# Patient Record
Sex: Female | Born: 1975 | Race: White | Hispanic: No | Marital: Married | Smoking: Former smoker
Health system: Southern US, Community
[De-identification: ages and names within clinical notes are randomized; demographics above are authoritative.]

## PROBLEM LIST (undated history)

## (undated) DIAGNOSIS — T7840XA Allergy, unspecified, initial encounter: Secondary | ICD-10-CM

## (undated) HISTORY — DX: Allergy, unspecified, initial encounter: T78.40XA

---

## 2013-05-26 ENCOUNTER — Ambulatory Visit: Payer: Self-pay | Admitting: Emergency Medicine

## 2015-04-20 ENCOUNTER — Other Ambulatory Visit: Payer: Self-pay | Admitting: Family Medicine

## 2015-04-20 DIAGNOSIS — Z1231 Encounter for screening mammogram for malignant neoplasm of breast: Secondary | ICD-10-CM

## 2015-04-23 ENCOUNTER — Ambulatory Visit: Payer: Self-pay

## 2015-05-15 ENCOUNTER — Ambulatory Visit
Admission: RE | Admit: 2015-05-15 | Discharge: 2015-05-15 | Disposition: A | Discharge status: Home / Self Care | Payer: PRIVATE HEALTH INSURANCE | Source: Ambulatory Visit | Attending: Family Medicine | Admitting: Family Medicine

## 2015-05-15 DIAGNOSIS — Z1231 Encounter for screening mammogram for malignant neoplasm of breast: Secondary | ICD-10-CM | POA: Diagnosis not present

## 2016-08-18 ENCOUNTER — Other Ambulatory Visit: Payer: Self-pay | Admitting: Family Medicine

## 2016-08-18 DIAGNOSIS — Z1231 Encounter for screening mammogram for malignant neoplasm of breast: Secondary | ICD-10-CM

## 2016-08-25 ENCOUNTER — Encounter (INDEPENDENT_AMBULATORY_CARE_PROVIDER_SITE_OTHER): Payer: Self-pay

## 2016-08-25 ENCOUNTER — Ambulatory Visit
Admission: RE | Admit: 2016-08-25 | Discharge: 2016-08-25 | Disposition: A | Discharge status: Home / Self Care | Payer: PRIVATE HEALTH INSURANCE | Source: Ambulatory Visit | Attending: Family Medicine | Admitting: Family Medicine

## 2016-08-25 DIAGNOSIS — Z1231 Encounter for screening mammogram for malignant neoplasm of breast: Secondary | ICD-10-CM | POA: Diagnosis present

## 2018-04-08 ENCOUNTER — Other Ambulatory Visit: Payer: Self-pay | Admitting: Family Medicine

## 2018-04-08 DIAGNOSIS — Z1231 Encounter for screening mammogram for malignant neoplasm of breast: Secondary | ICD-10-CM

## 2018-04-27 ENCOUNTER — Ambulatory Visit
Admission: RE | Admit: 2018-04-27 | Discharge: 2018-04-27 | Disposition: A | Discharge status: Home / Self Care | Payer: PRIVATE HEALTH INSURANCE | Source: Ambulatory Visit | Attending: Family Medicine | Admitting: Family Medicine

## 2018-04-27 DIAGNOSIS — Z1231 Encounter for screening mammogram for malignant neoplasm of breast: Secondary | ICD-10-CM | POA: Insufficient documentation

## 2019-05-24 ENCOUNTER — Other Ambulatory Visit: Payer: Self-pay

## 2019-05-24 ENCOUNTER — Ambulatory Visit (INDEPENDENT_AMBULATORY_CARE_PROVIDER_SITE_OTHER): Payer: PRIVATE HEALTH INSURANCE | Admitting: Podiatry

## 2019-05-24 ENCOUNTER — Ambulatory Visit (INDEPENDENT_AMBULATORY_CARE_PROVIDER_SITE_OTHER): Payer: PRIVATE HEALTH INSURANCE

## 2019-05-24 DIAGNOSIS — T7840XA Allergy, unspecified, initial encounter: Secondary | ICD-10-CM | POA: Insufficient documentation

## 2019-05-24 DIAGNOSIS — F329 Major depressive disorder, single episode, unspecified: Secondary | ICD-10-CM | POA: Insufficient documentation

## 2019-05-24 DIAGNOSIS — M722 Plantar fascial fibromatosis: Secondary | ICD-10-CM

## 2019-05-24 DIAGNOSIS — F418 Other specified anxiety disorders: Secondary | ICD-10-CM | POA: Insufficient documentation

## 2019-05-24 DIAGNOSIS — F32A Depression, unspecified: Secondary | ICD-10-CM | POA: Insufficient documentation

## 2019-05-24 DIAGNOSIS — J309 Allergic rhinitis, unspecified: Secondary | ICD-10-CM | POA: Insufficient documentation

## 2019-05-24 DIAGNOSIS — Z3042 Encounter for surveillance of injectable contraceptive: Secondary | ICD-10-CM | POA: Insufficient documentation

## 2019-05-24 MED ORDER — METHYLPREDNISOLONE 4 MG PO TBPK: ORAL_TABLET | ORAL | 0 refills | Status: DC

## 2019-05-24 MED ORDER — DICLOFENAC SODIUM 75 MG PO TBEC: 75.0000 mg | DELAYED_RELEASE_TABLET | Freq: Two times a day (BID) | ORAL | 1 refills | Status: DC

## 2019-05-26 NOTE — Progress Notes (Signed)
   Subjective: 43 y.o. female presenting today as a new patient with a chief complaint of stabbing, tearing pain to the bilateral heels that began about 2-3 months ago. She states the pain in the left heel radiates to the arch but is not as bad as the right heel. She states the pain is worse in the morning or when she stands after sitting for a long period of time. She has been using Voltaren Gel for treatment. Patient is here for further evaluation and treatment.   No past medical history on file.   Objective: Physical Exam General: The patient is alert and oriented x3 in no acute distress.  Dermatology: Skin is warm, dry and supple bilateral lower extremities. Negative for open lesions or macerations bilateral.   Vascular: Dorsalis Pedis and Posterior Tibial pulses palpable bilateral.  Capillary fill time is immediate to all digits.  Neurological: Epicritic and protective threshold intact bilateral.   Musculoskeletal: Tenderness to palpation to the plantar aspect of the bilateral heels along the plantar fascia. All other joints range of motion within normal limits bilateral. Strength 5/5 in all groups bilateral.   Radiographic exam: Normal osseous mineralization. Joint spaces preserved. No fracture/dislocation/boney destruction. No other soft tissue abnormalities or radiopaque foreign bodies.   Assessment: 1. plantar fasciitis bilateral feet  Plan of Care:  1. Patient evaluated. Xrays reviewed.   2. Injection of 0.5cc Celestone soluspan injected into the bilateral heels.  3. Rx for Medrol Dose Pak placed 4. Rx for Diclofenac ordered for patient. 5. Continue using OTC Voltaren gel 1%.  6. Continue using carbon fiber orthotics.  7. Return to clinic in 4 weeks.     Edrick Kins, DPM [facility]  Dr. Edrick Kins, DPM    [address], [place] [address]                Office [phone]  Fax [phone]

## 2019-05-30 ENCOUNTER — Other Ambulatory Visit: Payer: Self-pay | Admitting: Specialist

## 2019-05-30 DIAGNOSIS — Z1231 Encounter for screening mammogram for malignant neoplasm of breast: Secondary | ICD-10-CM

## 2019-06-21 ENCOUNTER — Ambulatory Visit: Payer: PRIVATE HEALTH INSURANCE | Admitting: Podiatry

## 2019-08-30 ENCOUNTER — Ambulatory Visit
Admission: RE | Admit: 2019-08-30 | Discharge: 2019-08-30 | Disposition: A | Discharge status: Home / Self Care | Payer: PRIVATE HEALTH INSURANCE | Source: Ambulatory Visit | Attending: Specialist | Admitting: Specialist

## 2019-08-30 ENCOUNTER — Other Ambulatory Visit: Payer: Self-pay

## 2019-08-30 DIAGNOSIS — Z1231 Encounter for screening mammogram for malignant neoplasm of breast: Secondary | ICD-10-CM | POA: Diagnosis present

## 2019-08-31 ENCOUNTER — Telehealth: Payer: Self-pay | Admitting: Podiatry

## 2019-08-31 NOTE — Telephone Encounter (Signed)
Returned call to patient to schedule appointment for her complaint of plantar fascitis. Left v/m mess.

## 2019-09-09 ENCOUNTER — Ambulatory Visit: Payer: PRIVATE HEALTH INSURANCE | Admitting: Podiatry

## 2019-10-28 ENCOUNTER — Other Ambulatory Visit: Payer: Self-pay

## 2019-10-28 ENCOUNTER — Ambulatory Visit: Payer: PRIVATE HEALTH INSURANCE | Admitting: Podiatry

## 2019-10-28 VITALS — Temp 97.6°F

## 2019-10-28 DIAGNOSIS — M722 Plantar fascial fibromatosis: Secondary | ICD-10-CM | POA: Diagnosis not present

## 2019-10-28 MED ORDER — DICLOFENAC SODIUM 75 MG PO TBEC: 75.0000 mg | DELAYED_RELEASE_TABLET | Freq: Two times a day (BID) | ORAL | 1 refills | Status: DC

## 2019-10-28 MED ORDER — TERBINAFINE HCL 250 MG PO TABS: 250.0000 mg | ORAL_TABLET | Freq: Every day | ORAL | 0 refills | Status: DC

## 2019-11-01 NOTE — Progress Notes (Signed)
   Subjective: 44 y.o. female presenting today for follow up evaluation of plantar fasciitis of the bilateral feet. She denies any current pain but is concerned that her 45 year old orthotics are not giving her enough support. She also reports some thickening and discoloration of the right great toenail that began a few months ago. She denies pain or modifying factors. She has not had any treatment for the symptoms. Patient is here for further evaluation and treatment.   No past medical history on file.   Objective: Physical Exam General: The patient is alert and oriented x3 in no acute distress.  Dermatology: Hyperkeratotic, discolored, thickened, onychodystrophy noted to the right great toenail. Skin is warm, dry and supple bilateral lower extremities. Negative for open lesions or macerations bilateral.   Vascular: Dorsalis Pedis and Posterior Tibial pulses palpable bilateral.  Capillary fill time is immediate to all digits.  Neurological: Epicritic and protective threshold intact bilateral.   Musculoskeletal: Tenderness to palpation to the plantar aspect of the bilateral heels along the plantar fascia. All other joints range of motion within normal limits bilateral. Strength 5/5 in all groups bilateral.    Assessment: 1. plantar fasciitis bilateral feet 2. Onychomycosis right great toenail   Plan of Care:  1. Patient evaluated.  2. Injection of 0.5cc Celestone soluspan injected into the right heel.  3. Refill prescription for Diclofenac provided to patient. 4. Appointment with Pedorthist for custom molded orthotics.  5. Prescription for Lamisil 250 mg #90 provided to patient.  6. Inquired about liver function and patient declines any known history of issue or problems.  7. Return to clinic as needed.   Felecia Shelling, DPM [facility]  Dr. Felecia Shelling, DPM    [address] [place], [place] [address]                Office  [phone]  Fax [phone]

## 2019-11-23 ENCOUNTER — Other Ambulatory Visit: Payer: PRIVATE HEALTH INSURANCE | Admitting: Orthotics

## 2019-12-07 ENCOUNTER — Ambulatory Visit: Payer: PRIVATE HEALTH INSURANCE | Admitting: Orthotics

## 2019-12-07 ENCOUNTER — Encounter: Payer: Self-pay | Admitting: *Deleted

## 2019-12-07 ENCOUNTER — Other Ambulatory Visit: Payer: Self-pay

## 2019-12-07 DIAGNOSIS — M722 Plantar fascial fibromatosis: Secondary | ICD-10-CM

## 2019-12-07 NOTE — Progress Notes (Signed)

## 2020-01-11 ENCOUNTER — Other Ambulatory Visit: Payer: Self-pay

## 2020-01-11 ENCOUNTER — Ambulatory Visit (INDEPENDENT_AMBULATORY_CARE_PROVIDER_SITE_OTHER): Payer: PRIVATE HEALTH INSURANCE | Admitting: Orthotics

## 2020-01-11 DIAGNOSIS — M722 Plantar fascial fibromatosis: Secondary | ICD-10-CM | POA: Diagnosis not present

## 2020-01-11 NOTE — Progress Notes (Signed)
Patient came in today to pick up custom made foot orthotics.  The goals were accomplished and the patient reported no dissatisfaction with said orthotics.  Patient was advised of breakin period and how to report any issues. 

## 2020-10-23 DIAGNOSIS — M722 Plantar fascial fibromatosis: Secondary | ICD-10-CM | POA: Insufficient documentation

## 2020-10-23 LAB — HM PAP SMEAR: HM Pap smear: NORMAL

## 2020-10-23 LAB — RESULTS CONSOLE HPV: CHL HPV: NEGATIVE

## 2020-10-24 ENCOUNTER — Other Ambulatory Visit: Payer: Self-pay | Admitting: Specialist

## 2020-10-24 DIAGNOSIS — Z1231 Encounter for screening mammogram for malignant neoplasm of breast: Secondary | ICD-10-CM

## 2020-12-11 ENCOUNTER — Other Ambulatory Visit: Payer: Self-pay

## 2020-12-11 ENCOUNTER — Ambulatory Visit: Payer: PRIVATE HEALTH INSURANCE | Admitting: Podiatry

## 2020-12-11 DIAGNOSIS — M722 Plantar fascial fibromatosis: Secondary | ICD-10-CM | POA: Diagnosis not present

## 2020-12-11 DIAGNOSIS — M79674 Pain in right toe(s): Secondary | ICD-10-CM | POA: Diagnosis not present

## 2020-12-11 DIAGNOSIS — B351 Tinea unguium: Secondary | ICD-10-CM | POA: Diagnosis not present

## 2020-12-11 DIAGNOSIS — M79675 Pain in left toe(s): Secondary | ICD-10-CM

## 2020-12-11 MED ORDER — DICLOFENAC SODIUM 75 MG PO TBEC: 75.0000 mg | DELAYED_RELEASE_TABLET | Freq: Two times a day (BID) | ORAL | 1 refills | Status: DC

## 2020-12-11 MED ORDER — METHYLPREDNISOLONE 4 MG PO TBPK: ORAL_TABLET | ORAL | 0 refills | Status: DC

## 2020-12-11 MED ORDER — BETAMETHASONE SOD PHOS & ACET 6 (3-3) MG/ML IJ SUSP
3.0000 mg | Freq: Once | INTRAMUSCULAR | Status: AC
  Administered 2020-12-11: 3 mg via INTRA_ARTICULAR

## 2020-12-11 MED ORDER — TERBINAFINE HCL 250 MG PO TABS: 250.0000 mg | ORAL_TABLET | Freq: Every day | ORAL | 0 refills | Status: DC

## 2020-12-11 NOTE — Progress Notes (Signed)
   Subjective: 45 y.o. female presenting today for follow up evaluation of plantar fasciitis of the bilateral feet.  She states that the injection she received last year helped significantly.  The injections lasted for over a year.  She continues to wear her orthotics daily and take diclofenac as needed  She is also concerned due to concerns of continued discoloration of the bilateral great toenails.  She has had Lamisil in the past which has helped significantly.  She recently had blood work drawn.  She presents for further treatment and evaluation   No past medical history on file.   Objective: Physical Exam General: The patient is alert and oriented x3 in no acute distress.  Dermatology: Hyperkeratotic, discolored, thickened, onychodystrophy noted to the right great toenail. Skin is warm, dry and supple bilateral lower extremities. Negative for open lesions or macerations bilateral.   Vascular: Dorsalis Pedis and Posterior Tibial pulses palpable bilateral.  Capillary fill time is immediate to all digits.  Neurological: Epicritic and protective threshold intact bilateral.   Musculoskeletal: Tenderness to palpation to the plantar aspect of the bilateral heels along the plantar fascia. All other joints range of motion within normal limits bilateral. Strength 5/5 in all groups bilateral.    Assessment: 1. plantar fasciitis bilateral feet 2. Onychomycosis right great toenail   Plan of Care:  1. Patient evaluated.  2. Injection of 0.5cc Celestone soluspan injected into the bilateral plantar fascia 3.  Prescription for Medrol Dosepak.  Refill prescription for Diclofenac provided to patient. 4.  Continue custom molded orthotics 5.  Today we discussed different treatment modalities to address the onychomycosis of the toenails including oral, topical, and laser antifungal treatment modalities.  The patient has had good success with Lamisil in the past.  She recently had a comprehensive  metabolic panel within normal limits. 6.  Prescription for Lamisil 250 mg #90 provided to patient.  7.  Return to clinic as needed.   Felecia Shelling, DPM [facility]  Dr. Felecia Shelling, DPM    [address] [place], [place] [address]                Office [phone]  Fax [phone]

## 2021-03-28 ENCOUNTER — Other Ambulatory Visit: Payer: Self-pay

## 2021-03-28 ENCOUNTER — Ambulatory Visit: Payer: PRIVATE HEALTH INSURANCE | Admitting: Family Medicine

## 2021-03-28 ENCOUNTER — Encounter: Payer: Self-pay | Admitting: Family Medicine

## 2021-03-28 VITALS — BP 102/82 | HR 83 | Temp 97.9°F | Ht 65.0 in | Wt 319.0 lb

## 2021-03-28 DIAGNOSIS — M25561 Pain in right knee: Secondary | ICD-10-CM

## 2021-03-28 MED ORDER — DICLOFENAC SODIUM 75 MG PO TBEC: 75.0000 mg | DELAYED_RELEASE_TABLET | Freq: Two times a day (BID) | ORAL | 0 refills | Status: DC

## 2021-03-28 NOTE — Assessment & Plan Note (Signed)
8-9 weeks of initially right and subsequent left knee pain, atraumatic in onset, no specific overuse relayed.  Pain to the lateral knees bilaterally with involvement posteriorly, no radiation proximally or distally, has noted tightness, no mechanical symptoms, no buckling or near buckling, minor instability to the point of utilizing cane for support.  Severe pain with bending, squatting, stairs, also pain with prolonged weightbearing.  Did dose diclofenac 75 mg on and off with initial improvement.  Following increased activity pain has dramatically worsened and returned.  Examination today reveals no discrete edema, range of motion 0- 110 limited by habitus, without pain, tenderness at the left quadriceps tendon insertional, medial joint line, McMurray's, anterior/posterior drawer, valgus/varus stressing all benign.  Single-leg knee bend on the left is asymptomatic, she cannot perform this on the right due to severe recreation of her presenting symptoms.  We will further evaluate patient's bilateral knee arthralgia with dedicated x-rays, concern for right patellofemoral related arthralgia, left symptoms focal to the patella and medial joint line, considered secondary/compensatory nature to the right knee.  I have advised restart of diclofenac 75 mg twice daily scheduled x2 weeks, activity restrictions, light duty work restrictions, and close follow-up in 2 weeks.  If suboptimal progress noted, pending x-ray results, can advance to intra-articular cortisone, escalation of oral pharmacotherapy.  Once improved, home-based versus formal PT to commence.

## 2021-03-28 NOTE — Patient Instructions (Signed)
-   Obtain knee x-rays with order provided - Restart diclofenac, dose every a.m. and every p.m. scheduled (take with food) - Ice x20 minutes over each knee twice daily and as needed - Gentle, nonstrenuous activity until follow-up - Can return to work with work restrictions until medically cleared - Return for follow-up in 2 weeks, contact us for any questions between now and then

## 2021-03-29 ENCOUNTER — Ambulatory Visit
Admission: RE | Admit: 2021-03-29 | Discharge: 2021-03-29 | Disposition: A | Discharge status: Home / Self Care | Payer: PRIVATE HEALTH INSURANCE | Attending: Family Medicine | Admitting: Family Medicine

## 2021-03-29 ENCOUNTER — Ambulatory Visit
Admission: RE | Admit: 2021-03-29 | Discharge: 2021-03-29 | Disposition: A | Discharge status: Home / Self Care | Payer: PRIVATE HEALTH INSURANCE | Source: Ambulatory Visit | Attending: Family Medicine | Admitting: Family Medicine

## 2021-03-29 ENCOUNTER — Other Ambulatory Visit: Payer: Self-pay

## 2021-03-29 DIAGNOSIS — M25561 Pain in right knee: Secondary | ICD-10-CM | POA: Diagnosis present

## 2021-03-29 NOTE — Assessment & Plan Note (Signed)
See additional assessment(s) for plan details. 

## 2021-03-29 NOTE — Progress Notes (Signed)
Primary Care / Sports Medicine Office Visit  Patient Information:  Patient ID: Tammie Grimes, female DOB: May 05, 1976 Age: 45 y.o. MRN: [id number]   Tammie Grimes is a pleasant 45 y.o. 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; exercises 3-4 days weekly, but has cut back to 1-2 days weekly due to the pain; stiff, shooting, displacement, grinding pain; 5/10 pain     Review of Systems pertinent details above   Patient Active Problem List   Diagnosis Date Noted   Arthralgia of knee, right 03/28/2021   Arthralgia of right knee 03/28/2021   Plantar fasciitis 10/23/2020   Allergic rhinitis 05/24/2019   Allergy 05/24/2019   Class 3 severe obesity with body mass index (BMI) of 45.0 to 49.9 in adult [facility]) 05/24/2019   Depo-Provera contraceptive status 05/24/2019   Depression 05/24/2019   Past Medical History:  Diagnosis Date   Allergy    Outpatient Encounter Medications as of 03/28/2021  Medication Sig   5-Hydroxytryptophan (5-HTP) 100 MG CAPS Take 1 capsule by mouth daily.   ACETAMINOPHEN PO Take 1,000 mg by mouth every 6 (six) hours as needed.   diclofenac (VOLTAREN) 75 MG EC tablet Take 1 tablet (75 mg total) by mouth 2 (two) times daily.   fluticasone (FLONASE) 50 MCG/ACT nasal spray Place 1 spray into both nostrils daily.   loratadine (CLARITIN) 10 MG tablet Take 10 mg by mouth daily.   montelukast (SINGULAIR) 10 MG tablet Take 10 mg by mouth at bedtime.   [DISCONTINUED] diclofenac (VOLTAREN) 75 MG EC tablet Take 1 tablet (75 mg total) by mouth 2 (two) times daily. (Patient not taking: Reported on 03/28/2021)   [DISCONTINUED] diclofenac (VOLTAREN) 75 MG EC  tablet Take 75 mg by mouth in the morning and at bedtime.   [DISCONTINUED] diphenhydrAMINE (BENADRYL) 25 MG tablet Take by mouth.   [DISCONTINUED] ipratropium (ATROVENT) 0.06 % nasal spray Place into the nose.   [DISCONTINUED] Lorcaserin HCl 10 MG TABS Take by mouth.   [DISCONTINUED] medroxyPROGESTERone (DEPO-PROVERA) 150 MG/ML injection Inject into the muscle.   [DISCONTINUED] methylPREDNISolone (MEDROL DOSEPAK) 4 MG TBPK tablet 6 day dose pack - take as directed   [DISCONTINUED] phentermine 15 MG capsule 1 po qday   [DISCONTINUED] terbinafine (LAMISIL) 250 MG tablet Take 1 tablet (250 mg total) by mouth daily.   No facility-administered encounter medications on file as of 03/28/2021.   History reviewed. No pertinent surgical history.  Vitals:   03/28/21 1646  BP: 102/82  Pulse: 83  Temp: 97.9 F (36.6 C)  SpO2: 99%   Vitals:   03/28/21 1646  Weight: (!) 319 lb (144.7 kg)  Height: 5\' 5"  (1.651 m)   Body mass index is 53.08 kg/m.  No results found.   Independent interpretation of notes and tests performed by another provider:   None  Procedures performed:   None  Pertinent History, Exam, Impression, and Recommendations:   Arthralgia of knee, right 8-9 weeks of initially right and subsequent left knee pain, atraumatic in onset, no specific overuse relayed.  Pain to the lateral knees bilaterally with involvement posteriorly, no radiation proximally or distally, has noted tightness, no mechanical symptoms, no buckling or  near buckling, minor instability to the point of utilizing cane for support.  Severe pain with bending, squatting, stairs, also pain with prolonged weightbearing.  Did dose diclofenac 75 mg on and off with initial improvement.  Following increased activity pain has dramatically worsened and returned.  Examination today reveals no discrete edema, range of motion 0- 110 limited by habitus, without pain, tenderness at the left quadriceps tendon insertional,  medial joint line, McMurray's, anterior/posterior drawer, valgus/varus stressing all benign.  Single-leg knee bend on the left is asymptomatic, she cannot perform this on the right due to severe recreation of her presenting symptoms.  We will further evaluate patient's bilateral knee arthralgia with dedicated x-rays, concern for right patellofemoral related arthralgia, left symptoms focal to the patella and medial joint line, considered secondary/compensatory nature to the right knee.  I have advised restart of diclofenac 75 mg twice daily scheduled x2 weeks, activity restrictions, light duty work restrictions, and close follow-up in 2 weeks.  If suboptimal progress noted, pending x-ray results, can advance to intra-articular cortisone, escalation of oral pharmacotherapy.  Once improved, home-based versus formal PT to commence.  Arthralgia of right knee See additional assessment(s) for plan details.   Orders & Medications Meds ordered this encounter  Medications   diclofenac (VOLTAREN) 75 MG EC tablet    Sig: Take 1 tablet (75 mg total) by mouth 2 (two) times daily.    Dispense:  30 tablet    Refill:  0   Orders Placed This Encounter  Procedures   DG Knee Complete 4 Views Right   DG Knee Complete 4 Views Left     Return in about 2 weeks (around 04/11/2021) for f/u knees.     Jerrol Banana, MD   Primary Care Sports Medicine [facility]

## 2021-04-10 ENCOUNTER — Ambulatory Visit: Payer: PRIVATE HEALTH INSURANCE | Admitting: Family Medicine

## 2021-04-10 ENCOUNTER — Ambulatory Visit (INDEPENDENT_AMBULATORY_CARE_PROVIDER_SITE_OTHER): Payer: PRIVATE HEALTH INSURANCE | Admitting: Family Medicine

## 2021-04-10 ENCOUNTER — Encounter: Payer: Self-pay | Admitting: Family Medicine

## 2021-04-10 ENCOUNTER — Other Ambulatory Visit: Payer: Self-pay

## 2021-04-10 VITALS — BP 108/82 | HR 82 | Ht 65.0 in | Wt 320.0 lb

## 2021-04-10 DIAGNOSIS — M1712 Unilateral primary osteoarthritis, left knee: Secondary | ICD-10-CM | POA: Diagnosis not present

## 2021-04-10 DIAGNOSIS — M1711 Unilateral primary osteoarthritis, right knee: Secondary | ICD-10-CM

## 2021-04-10 DIAGNOSIS — M25561 Pain in right knee: Secondary | ICD-10-CM

## 2021-04-10 DIAGNOSIS — Z6841 Body Mass Index (BMI) 40.0 and over, adult: Secondary | ICD-10-CM | POA: Insufficient documentation

## 2021-04-10 MED ORDER — DICLOFENAC SODIUM 75 MG PO TBEC: 75.0000 mg | DELAYED_RELEASE_TABLET | Freq: Two times a day (BID) | ORAL | 0 refills | Status: AC

## 2021-04-10 NOTE — Patient Instructions (Addendum)
-   Continue diclofenac twice daily with food - Start physical therapy - Use calorie counter and try to achieve negative calorie balance - Return in 6 weeks - Contact for questions, work notes, pain that prevents PT participation  [facility] Physical Therapy:  [place]:  [phone]  [place]: [phone]

## 2021-04-10 NOTE — Assessment & Plan Note (Signed)
See additional assessment(s) for plan details. 

## 2021-04-10 NOTE — Assessment & Plan Note (Signed)
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  Examination findings show primary tenderness at the medial tibiofemoral articulation, secondary involvement of the peroneal tendons on the right proximally.  I reviewed various treatment strategies given her BMI, radiographic findings, and clinical progress.  She is to start formal physical therapy, continue scheduled diclofenac, we will reach out for authorization for viscosupplementation bilaterally, and coordinate a follow-up in 6 weeks time.  She was advised to contact our office for any barriers to the shared plan, if noted sooner visit to be scheduled to discuss alternate management options.

## 2021-04-10 NOTE — Progress Notes (Signed)
Primary Care / Sports Medicine Office Visit  Patient Information:  Patient ID: Tammie Grimes, female DOB: 01/01/1976 Age: 45 y.o. MRN: [id number]   Tammie Grimes is a pleasant 45 y.o. female presenting with the following:  Chief Complaint  Patient presents with   Knee Pain    Taking diclofenac twice daily and light duty at work with relief; has been experiencing right-sided calf pain since last visit; 1/10 pain today    Review of Systems pertinent details above   Patient Active Problem List   Diagnosis Date Noted   Primary osteoarthritis of right knee 04/10/2021   Primary osteoarthritis of left knee 04/10/2021   BMI 50.0-59.9, adult (HCC) 04/10/2021   Plantar fasciitis 10/23/2020   Allergic rhinitis 05/24/2019   Allergy 05/24/2019   Depo-Provera contraceptive status 05/24/2019   Depression 05/24/2019   Past Medical History:  Diagnosis Date   Allergy    Outpatient Encounter Medications as of 04/10/2021  Medication Sig   5-Hydroxytryptophan (5-HTP) 100 MG CAPS Take 1 capsule by mouth daily.   ACETAMINOPHEN PO Take 1,000 mg by mouth every 6 (six) hours as needed.   fluticasone (FLONASE) 50 MCG/ACT nasal spray Place 1 spray into both nostrils daily.   loratadine (CLARITIN) 10 MG tablet Take 10 mg by mouth daily.   montelukast (SINGULAIR) 10 MG tablet Take 10 mg by mouth at bedtime.   [DISCONTINUED] diclofenac (VOLTAREN) 75 MG EC tablet Take 1 tablet (75 mg total) by mouth 2 (two) times daily.   diclofenac (VOLTAREN) 75 MG EC tablet Take 1 tablet (75 mg total) by mouth 2 (two) times daily.   No facility-administered encounter medications on file as of 04/10/2021.   No past surgical history on file.  Vitals:   04/10/21 1153  BP: 108/82  Pulse: 82  SpO2: 96%   Vitals:   04/10/21 1153  Weight: (!) 320 lb (145.2 kg)  Height: 5\' 5"  (1.651 m)   Body mass index is 53.25 kg/m.  DG Knee Complete 4 Views Left  Result Date: 03/31/2021 CLINICAL DATA:  Left knee  pain.  No known injury. EXAM: LEFT KNEE - COMPLETE 4+ VIEW COMPARISON:  None. FINDINGS: Sunrise radiograph is degraded due to obliquity. No fracture or dislocation. Knee joint spaces are preserved. There is minimal spurring the tibial spines. No evidence of chondrocalcinosis. No joint effusion. Minimal enthesopathic change involving the tibial tuberosity. Regional soft tissues appear normal. IMPRESSION: Minimal enthesopathic change involving the tibial tuberosity nonspecific though could be seen as a sequela of previous Osgood-Schlatter's disease and/or patellar tendinitis. Otherwise, no explanation for patient's left knee pain. Electronically Signed   By: 04/02/2021 M.D.   On: 03/31/2021 09:59   DG Knee Complete 4 Views Right  Result Date: 03/31/2021 CLINICAL DATA:  Right knee pain for the past 3 months. EXAM: RIGHT KNEE - COMPLETE 4+ VIEW COMPARISON:  None. FINDINGS: No fracture or dislocation. Mild degenerative change of the patellofemoral joint with joint space loss, subchondral sclerosis and osteophytosis. There is minimal spurring the tibial spines. Minimal enthesopathic change involving the superior pole of the patella. No evidence of chondrocalcinosis. Regional soft tissues appear normal. IMPRESSION: 1. No acute findings. 2. Mild degenerative change of the patellofemoral joint. Electronically Signed   By: 04/02/2021 M.D.   On: 03/31/2021 09:57     Independent interpretation of notes and tests performed by another provider:   Independent interpretation of bilateral knee x-rays dated 03/31/2021 reveal mild-moderate tricompartmental degenerative changes with evidence of  probable dynamic patellar maltracking, subchondral sclerosis at the medial tibiofemoral joint line, patellofemoral articulation bilaterally, cortical roughening at the left lateral joint line, prominence of the tibial tuberosity left greater than right, no acute osseous process identified  Procedures performed:   None  Pertinent  History, Exam, Impression, and Recommendations:   BMI 50.0-59.9, adult (HCC) Comorbid elevated BMI 53.25, clinically account for worsening symptomatology of bilateral knees.  Discussion about nutritionist, caloric deficit provided.  At follow-up if absent or insufficient weight loss noted, bariatric surgery to be discussed.  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  Examination findings show primary tenderness at the medial tibiofemoral articulation, secondary involvement of the peroneal tendons on the right proximally.  I reviewed various treatment strategies given her BMI, radiographic findings, and clinical progress.  She is to start formal physical therapy, continue scheduled diclofenac, we will reach out for authorization for viscosupplementation bilaterally, and coordinate a follow-up in 6 weeks time.  She was advised to contact our office for any barriers to the shared plan, if noted sooner visit to be scheduled to discuss alternate management options.  Primary osteoarthritis of left knee See additional assessment(s) for plan details.    Orders & Medications Meds ordered this encounter  Medications   diclofenac (VOLTAREN) 75 MG EC tablet    Sig: Take 1 tablet (75 mg total) by mouth 2 (two) times daily.    Dispense:  90 tablet    Refill:  0   Orders Placed This Encounter  Procedures   Ambulatory referral to Physical Therapy     Return in about 6 weeks (around 05/22/2021).     Jerrol Banana, MD   Primary Care Sports Medicine [facility]

## 2021-04-10 NOTE — Assessment & Plan Note (Signed)
Comorbid elevated BMI 53.25, clinically account for worsening symptomatology of bilateral knees.  Discussion about nutritionist, caloric deficit provided.  At follow-up if absent or insufficient weight loss noted, bariatric surgery to be discussed.

## 2021-04-12 ENCOUNTER — Encounter: Payer: Self-pay | Admitting: Family Medicine

## 2021-04-15 ENCOUNTER — Encounter: Payer: Self-pay | Admitting: Family Medicine

## 2021-04-15 NOTE — Telephone Encounter (Signed)
Patient notified and verbalized understanding.  Scheduled patient tomorrow at 2:40 PM for injection.  For your information.

## 2021-04-16 ENCOUNTER — Encounter: Payer: Self-pay | Admitting: Family Medicine

## 2021-04-16 ENCOUNTER — Ambulatory Visit (INDEPENDENT_AMBULATORY_CARE_PROVIDER_SITE_OTHER): Payer: PRIVATE HEALTH INSURANCE | Admitting: Family Medicine

## 2021-04-16 ENCOUNTER — Other Ambulatory Visit: Payer: Self-pay

## 2021-04-16 ENCOUNTER — Inpatient Hospital Stay: Payer: Self-pay | Admitting: Radiology

## 2021-04-16 ENCOUNTER — Inpatient Hospital Stay (INDEPENDENT_AMBULATORY_CARE_PROVIDER_SITE_OTHER): Payer: PRIVATE HEALTH INSURANCE | Admitting: Radiology

## 2021-04-16 VITALS — BP 108/82 | HR 94 | Temp 98.1°F | Ht 65.0 in | Wt 316.0 lb

## 2021-04-16 DIAGNOSIS — M1712 Unilateral primary osteoarthritis, left knee: Secondary | ICD-10-CM

## 2021-04-16 DIAGNOSIS — M1711 Unilateral primary osteoarthritis, right knee: Secondary | ICD-10-CM | POA: Diagnosis not present

## 2021-04-16 NOTE — Patient Instructions (Addendum)
You have just been given a cortisone injection to reduce pain and inflammation. After the injection you may notice immediate relief of pain as a result of the Lidocaine. It is important to rest the area of the injection for 24 to 48 hours after the injection. There is a possibility of some temporary increased discomfort and swelling for up to 72 hours until the cortisone begins to work. If you do have pain, simply rest the joint and use ice. If you can tolerate over the counter medications, you can try Tylenol, Aleve, or Advil for added relief per package instructions. - Dose diclofenac 75 mg twice daily until symptoms respond to cortisone then transition to as-needed dosing - Dose Tylenol as-needed - We will contact you for follow-up

## 2021-04-17 MED ORDER — TRIAMCINOLONE ACETONIDE 40 MG/ML IJ SUSP
80.0000 mg | Freq: Once | INTRAMUSCULAR | Status: AC
  Administered 2021-04-16: 80 mg

## 2021-04-18 ENCOUNTER — Other Ambulatory Visit: Payer: Self-pay | Admitting: Family Medicine

## 2021-04-18 DIAGNOSIS — Z1231 Encounter for screening mammogram for malignant neoplasm of breast: Secondary | ICD-10-CM

## 2021-04-22 NOTE — Assessment & Plan Note (Addendum)
See additional assessment(s) for plan details. 

## 2021-04-22 NOTE — Assessment & Plan Note (Signed)
Patient with severe exacerbation of his known tricompartmental osteoarthritis bilateral knees, examination is consistent with the same, no effusion is present.  Given her current treatments, we did discuss escalation of therapy options and she did elect to proceed with bilateral ultrasound-guided intra-articular knee injections.  She tolerated these well, post care reviewed.  We will still seek authorization for viscosupplementation for both knees have patient return that time.

## 2021-04-22 NOTE — Progress Notes (Signed)
Primary Care / Sports Medicine Office Visit  Patient Information:  Patient ID: Tammie Grimes, female DOB: 1976/04/22 Age: 45 y.o. MRN: [id number]   Tammie Grimes is a pleasant 45 y.o. female presenting with the following:  Chief Complaint  Patient presents with   Primary osteoarthritis of right knee    Yesterday, 04/15/21; took a misstep with right foot while walking and came down wrong on it; now left knee is hurting worse than right; 10/10 pain yesterday    Review of Systems pertinent details above   Patient Active Problem List   Diagnosis Date Noted   Primary osteoarthritis of right knee 04/10/2021   Primary osteoarthritis of left knee 04/10/2021   BMI 50.0-59.9, adult (HCC) 04/10/2021   Plantar fasciitis 10/23/2020   Allergic rhinitis 05/24/2019   Allergy 05/24/2019   Depo-Provera contraceptive status 05/24/2019   Depression 05/24/2019   Past Medical History:  Diagnosis Date   Allergy    Outpatient Encounter Medications as of 04/16/2021  Medication Sig   5-Hydroxytryptophan (5-HTP) 100 MG CAPS Take 1 capsule by mouth daily.   ACETAMINOPHEN PO Take 1,000 mg by mouth every 6 (six) hours as needed.   diclofenac (VOLTAREN) 75 MG EC tablet Take 1 tablet (75 mg total) by mouth 2 (two) times daily.   fluticasone (FLONASE) 50 MCG/ACT nasal spray Place 1 spray into both nostrils daily.   loratadine (CLARITIN) 10 MG tablet Take 10 mg by mouth daily.   montelukast (SINGULAIR) 10 MG tablet Take 10 mg by mouth at bedtime.   [EXPIRED] triamcinolone acetonide (KENALOG-40) injection 80 mg    No facility-administered encounter medications on file as of 04/16/2021.   History reviewed. No pertinent surgical history.  Vitals:   04/16/21 1408  BP: 108/82  Pulse: 94  Temp: 98.1 F (36.7 C)  SpO2: 98%   Vitals:   04/16/21 1408  Weight: (!) 316 lb (143.3 kg)  Height: 5\' 5"  (1.651 m)   Body mass index is 52.59 kg/m.     Independent interpretation of notes and  tests performed by another provider:   None  Procedures performed:   Procedure:  Injection of right intra-articular knee under ultrasound guidance. Ultrasound guidance utilized for in plane suprapatellar approach, confirmation of no effusion and injectate placement. Samsung HS60 device utilized with permanent recording / reporting. Consent obtained and verified. Skin prepped in a sterile fashion. Ethyl chloride spray for topical local analgesia.  Completed without difficulty and tolerated well. Medication: triamcinolone acetonide 40 mg/mL suspension for injection 1 mL total and 2 mL lidocaine 1% without epinephrine utilized for needle placement anesthetic Advised to contact for fevers/chills, erythema, induration, drainage, or persistent bleeding.  Procedure:  Injection of left intra-articular knee under ultrasound guidance. Ultrasound guidance utilized for confirmation of absent effusion, in plane suprapatellar approach, hypoechoic response from injectate administration Samsung HS60 device utilized with permanent recording / reporting. Consent obtained and verified. Skin prepped in a sterile fashion. Ethyl chloride spray for topical local analgesia.  Completed without difficulty and tolerated well. Medication: triamcinolone acetonide 40 mg/mL suspension for injection 1 mL total and 2 mL lidocaine 1% without epinephrine utilized for needle placement anesthetic Advised to contact for fevers/chills, erythema, induration, drainage, or persistent bleeding.   Pertinent History, Exam, Impression, and Recommendations:   Primary osteoarthritis of right knee Patient with severe exacerbation of his known tricompartmental osteoarthritis bilateral knees, examination is consistent with the same, no effusion is present.  Given her current treatments, we did discuss escalation of  therapy options and she did elect to proceed with bilateral ultrasound-guided intra-articular knee injections.  She  tolerated these well, post care reviewed.  We will still seek authorization for viscosupplementation for both knees have patient return that time.  Primary osteoarthritis of left knee See additional assessment(s) for plan details.   Orders & Medications Meds ordered this encounter  Medications   triamcinolone acetonide (KENALOG-40) injection 80 mg   Orders Placed This Encounter  Procedures   [place] LIMITED JOINT SPACE STRUCTURES LOW LEFT   [place] LIMITED JOINT SPACE STRUCTURES LOW RIGHT     Return if symptoms worsen or fail to improve.     Jerrol Banana, MD   Primary Care Sports Medicine [facility]

## 2021-04-24 ENCOUNTER — Other Ambulatory Visit: Payer: Self-pay

## 2021-04-24 ENCOUNTER — Ambulatory Visit: Payer: PRIVATE HEALTH INSURANCE | Attending: Family Medicine | Admitting: Physical Therapy

## 2021-04-24 ENCOUNTER — Encounter: Payer: Self-pay | Admitting: Physical Therapy

## 2021-04-24 DIAGNOSIS — M25561 Pain in right knee: Secondary | ICD-10-CM | POA: Insufficient documentation

## 2021-04-24 DIAGNOSIS — R29898 Other symptoms and signs involving the musculoskeletal system: Secondary | ICD-10-CM | POA: Insufficient documentation

## 2021-04-24 DIAGNOSIS — G8929 Other chronic pain: Secondary | ICD-10-CM | POA: Diagnosis present

## 2021-04-24 DIAGNOSIS — M25562 Pain in left knee: Secondary | ICD-10-CM | POA: Diagnosis present

## 2021-04-24 DIAGNOSIS — M1711 Unilateral primary osteoarthritis, right knee: Secondary | ICD-10-CM | POA: Insufficient documentation

## 2021-04-24 DIAGNOSIS — M1712 Unilateral primary osteoarthritis, left knee: Secondary | ICD-10-CM | POA: Insufficient documentation

## 2021-04-24 DIAGNOSIS — M170 Bilateral primary osteoarthritis of knee: Secondary | ICD-10-CM | POA: Insufficient documentation

## 2021-04-24 NOTE — Therapy (Deleted)
[facility] [address] [place], [place], [address] Phone: [phone]   Fax:  [phone]  Physical Therapy Treatment and Initial Evaluation 04/24/2021  Patient Details  Name: Tammie Grimes MRN: [id number] Date of Birth: 09-16-1975 Referring Provider (PT): Joseph Berkshire, MD  Encounter Date: 04/24/2021   PT End of Session - 04/24/21 1501     Visit Number 1    Number of Visits 16    Date for PT Re-Evaluation 06/19/21    Authorization Type First 6 visits authorization    Authorization Time Period First 6 visits authorization    Authorization - Visit Number 1    Authorization - Number of Visits 10    Progress Note Due on Visit 10    PT Start Time 1346    PT Stop Time 1444    PT Time Calculation (min) 58 min    Activity Tolerance Patient tolerated treatment well;Patient limited by fatigue;No increased pain    Behavior During Therapy WFL for tasks assessed/performed             Past Medical History:  Diagnosis Date   Allergy     History reviewed. No pertinent surgical history.  There were no vitals filed for this visit.   Subjective Assessment - 04/24/21 1453     Subjective Pt states onset of bilat knee pain in July 2022. Pt reports joined a gym in February 2022 and was going 3x a weeks with at least 30 mins of treadmill activity. Pt has stopped going to the gym since July 2/2 to bilat knee pain. Pt reports injections in both knees on 04/16/2021. Pt states that the injections have greatly improved her sx. Pt states that at worse she was amb with a cane and only able to move short home-related distances.    Pertinent History PMH with h/o depression, BMI: 50-59.9, Injection 04/16/2021, and plantar fasciitis. Pt would like to return to [employer] for independent exercise soon.    Limitations Walking;Lifting;Standing    How long can you sit comfortably? WNL    How long can you stand comfortably? 10 mins    How long  can you walk comfortably? 1 hour    Diagnostic tests x-rays dated 03/31/2021 reveal mild-moderate tricompartmental degenerative changes    Patient Stated Goals Need to be able to squat and lift for work. Able to climb stairs. Longer bouts of standing on feet.    Currently in Pain? Yes    Pain Score 0-No pain    Pain Location Knee    Pain Orientation Right;Left    Pain Descriptors / Indicators Aching;Sharp    Pain Type Chronic pain    Pain Onset More than a month ago    Pain Frequency Intermittent    Aggravating Factors  Walking and Stairs.    Pain Relieving Factors Elevation and Ice    Effect of Pain on Daily Activities reduced lifting tolerance at work and short walking distances then optimal.             SUBJECTIVE Chief complaint: Pt states onset of bilat knee pain in July 2022. Pt reports joined a gym in February 2022 and was going 3x a weeks with atleast 30 mins of treadmill activity. Pt has stopped going to the gym since July 2/2 to bilat knee pain. Pt reports injections in both knees on 04/16/2021. Pt states that the injections have greatly improved her sx. Pt states that at worse she was amb with a  cane and only able to move short home-related distances.    History: PMH with h/o depression, BMI: 50-59.9, Injection 04/16/2021, and plantar fasciitis. Pt would like to return to [employer] for independent exercise soon.   Referring Dx: Primary osteoarthritis of right and left  knee Referring Provider: Joseph Berkshire, MD Pain location: Bilat knees, more focal in the back of the knee compared to the front.  Pain: Present 0/10, Best 0/10, Worst 10/10: Pain quality: Ache, burning,  Radiating pain: Pt states that she has pinching in her R hip, does not know if it is related.  Numbness/Tingling: none  24 hour pain behavior: Activity related.  Aggravating factors: Walking, Stairs.  Easing factors: Elevation and Ice.  How long can you sit: WNL  How long can you stand: 10 mins  How  long can you walk: 1hr  History of back or LE injury, pain, surgery, or therapy: None Follow-up appointment with MD: 05/02/2021. Possible pushing that back to understand response to PT.  Dominant hand: Right Imaging: x-rays dated 03/31/2021 reveal mild-moderate tricompartmental degenerative changes Falls in the last 6 months: None Occupational demands: Pt works in a [employer] with up to 13 hours. 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  Hobbies: Going events, and walking around.  Goals: Need to be able to squat and lift for work. Able to climb stair. Longer bouts of standing on feet.  Red flags (bowel/bladder changes, saddle paresthesia, personal history of cancer, chills/fever, night sweats, unrelenting pain, first onset of insidious LBP <20 y/o) Negative    OBJECTIVE   FOTO:l 52 with a predicted value of 70   Mental Status Patient is oriented to person, place and time.  Recent memory is intact.  Remote memory is intact.  Attention span and concentration are intact.  Expressive speech is intact.  Patient's fund of knowledge is within normal limits for educational level.  SENSATION: Grossly intact to light touch bilateral LEs as determined by testing dermatomes L2-S2    MUSCULOSKELETAL: Tremor: None Bulk: Normal Tone: Normal No visible step-off along spinal column  Posture Lumbar lordosis: WNL Iliac crest height: equal bilaterally Lumbar lateral shift: negative Lower crossed syndrome (tight hip flexors and erector spinae; weak gluts and abs): negative  Gait Grossly WNL with mild bilat toe out and bilat trendelenburg.     Palpation - TtP at bilat pes anserine and Post knee L>R. No joint line tenderness at the time of initial evaluation.      Strength (out of 5) R/L 4/4 Hip flexion 4/4 Hip ER 5/5 Hip IR 5/5 Hip abduction 5/5 Hip adduction 4/4 Knee extension 4/4 Knee flexion * pain with L knee flexion* 5/5 Ankle dorsiflexion    *Indicates  pain   AROM (degrees) R/L (all movements include overpressure unless otherwise stated)  Hip Flexion (0-125): WFL Hip Abduction (0-40): WFL Knee Flexion (0-135): R: 125 L: 125 soft end feel bilat 2/2 soft tissue approximation.   Knee Extension (0): R:0 L: 0 Ankle DF (0-35) WNL  Ankle PL (0-50) WNL  *Indicates pain   PROM (degrees) = to AROM.       Muscle Length Hamstrings: R:0 degrees L: lacking 24  degrees       SPECIAL TESTS Lumbar Radiculopathy and Discogenic: Slump (SN 83, -LR 0.32): neg bilat   SLR (SN 92, -LR 0.29): neg bilat   Crossed SLR (SP 90): neg bilat      Hip: FABER (SN 81):  neg bilat  FADIR (SN 94): R:  neg bilat    Piriformis Syndrome: FAIR Test (SN 88, SP 83): neg bilat   Functional Tasks Lifting: Pt was able to lift 17 # box with proper form with verbal cueing. Pt states quad fatigue after 10 reps.   Squatting: Pt displays knees over toes during first set of 5. Pt states minor ant knee pain. Pt was able to complete squat with correct form and no pain with mild-mod verbal cueing.   Sit to stand: WNL  Stairs: 4 steps x3 without rails. Slight increase in pain in the R knee compared to the L. Slight toe out of L foot compared to R.    HEP  Access Code: [email or web address] URL: [email or web address] Date: 04/24/2021 Prepared by: Dorene Grebe  Exercises  Standing Hip Abduction with Counter Support - 1 x daily - 5 x weekly - 3 sets - 10 reps Standing Knee Flexion with Counter Support - 1 x daily - 5 x weekly - 3 sets - 10 reps Heel Raises with Counter Support - 1 x daily - 5 x weekly - 3 sets - 10 reps Squat with Chair Touch - 1 x daily - 5 x weekly - 3 sets - 10 reps      [facility] PT Assessment - 04/24/21 0001       Assessment   Medical Diagnosis Primary osteoarthritis of right and left  knee    Referring Provider (PT) Joseph Berkshire, MD    Onset Date/Surgical Date 01/04/21    Hand Dominance Right    Next MD Visit 05/02/2021     Prior Therapy Long time ago for shoulder      Precautions   Precautions None      Restrictions   Weight Bearing Restrictions No      Balance Screen   Has the patient fallen in the past 6 months No      Home Environment   Living Environment Private residence      Prior Function   Level of Independence Independent                    Plan - 04/24/21 1504     Clinical Impression Statement Pt is a pleasant 45 year-old female referred for: Primary osteoarthritis of right and left  knee.  PT examination reveals the following deficits: NPS: best/current pain 0/10, worse pain 10/10. FOTO: 52 with a predicted improvement value of 70. LE MMT(R/L): 4/4 Hip flexion. 4/4 Hip ER. 5/5 Hip IR. 5/5 Hip abduction. 5/5 Hip adduction. 4/4 Knee extension.  4/4 Knee flexion * pain with L knee flexion*. 5/5 Ankle dorsiflexion. Lifting: Pt was able to lift 17 # box with proper form with verbal cueing. Pt states quad fatigue after 10 reps. Stairs: 4 steps x3 without rails. Slight increase in pain in the R knee compared to the L. Slight toe out of L foot compared to R. Pt case displays as moderate complexity with good prognosis for optimal return to PLOF due to time from onset, current level of daily physically activity, PMH, and PLOF. Pt will benefit from skilled PT 2x/week for 8 weeks to address deficits and promote safe independence with community and home related mobility and ADLs.    Personal Factors and Comorbidities Comorbidity 3+;Past/Current Experience;Profession    Comorbidities see PMH    Examination-Activity Limitations Stairs;Stand;Transfers;Locomotion Level;Squat;Lift    Examination-Participation Restrictions Occupation    Stability/Clinical Decision Making Evolving/Moderate complexity    Clinical Decision Making Moderate  Rehab Potential Good    PT Frequency 2x / week    PT Duration 8 weeks    PT Treatment/Interventions Aquatic Therapy;Biofeedback;Electrical Stimulation;Traction;Moist  Heat;Ultrasound;Gait training;Stair training;Functional mobility training;Therapeutic activities;Therapeutic exercise;Balance training;Neuromuscular re-education;Passive range of motion;Dry needling;Energy conservation;Joint Manipulations;Spinal Manipulations    PT Next Visit Plan Reassess HEP adherence    PT Home Exercise Plan [email or web address]    Recommended Other Services Not at this time. Pool based exercises will be recommended during POC    Consulted and Agree with Plan of Care Patient             Patient will benefit from skilled therapeutic intervention in order to improve the following deficits and impairments:  Abnormal gait, Decreased balance, Decreased activity tolerance, Decreased endurance, Decreased coordination, Decreased mobility, Decreased range of motion, Hypomobility, Decreased strength, Impaired flexibility, Improper body mechanics, Pain, Obesity, Postural dysfunction  Visit Diagnosis: Chronic pain of right knee  Chronic pain of left knee  Bilateral primary osteoarthritis of knee  Impaired strength of lower extremity     Problem List Patient Active Problem List   Diagnosis Date Noted   Primary osteoarthritis of right knee 04/10/2021   Primary osteoarthritis of left knee 04/10/2021   BMI 50.0-59.9, adult (HCC) 04/10/2021   Plantar fasciitis 10/23/2020   Allergic rhinitis 05/24/2019   Allergy 05/24/2019   Depo-Provera contraceptive status 05/24/2019   Depression 05/24/2019    Cammie Mcgee, PT 04/24/2021, 5:03 PM  [facility] [address] [place], [place], [address] Phone: [phone]   Fax:  [phone]  Name: Tammie Grimes MRN: [id number] Date of Birth: 01/09/1976

## 2021-04-24 NOTE — Therapy (Signed)
[place] [facility] [address] [place], [place], [address] Phone: [phone]   Fax:  [phone]  Physical Therapy Evaluation  Patient Details  Name: Tammie Grimes MRN: [id number] Date of Birth: [birth year] Referring Provider (PT): Joseph Berkshire, MD   Encounter Date: 04/24/2021   PT End of Session - 04/24/21 1501     Visit Number 1    Number of Visits 16    Date for PT Re-Evaluation 06/19/21    Authorization Type First 6 visits authorization    Authorization Time Period First 6 visits authorization    Authorization - Visit Number 1    Authorization - Number of Visits 10    Progress Note Due on Visit 10    PT Start Time 1346    PT Stop Time 1444    PT Time Calculation (min) 58 min    Activity Tolerance Patient tolerated treatment well;Patient limited by fatigue;No increased pain    Behavior During Therapy WFL for tasks assessed/performed             Past Medical History:  Diagnosis Date   Allergy     History reviewed. No pertinent surgical history.  There were no vitals filed for this visit.    Subjective Assessment - 04/24/21 1453     Subjective Pt states onset of bilat knee pain in July 2022. Pt reports joined a gym in February 2022 and was going 3x a weeks with at least 30 mins of treadmill activity. Pt has stopped going to the gym since July 2/2 to bilat knee pain. Pt reports injections in both knees on 04/16/2021. Pt states that the injections have greatly improved her sx. Pt states that at worse she was amb with a cane and only able to move short home-related distances.    Pertinent History PMH with h/o depression, BMI: 50-59.9, Injection 04/16/2021, and plantar fasciitis. Pt would like to return to [employer] for independent exercise soon.    Limitations Walking;Lifting;Standing    How long can you sit comfortably? WNL    How long can you stand comfortably? 10 mins    How long can you walk comfortably? 1  hour    Diagnostic tests x-rays dated 03/31/2021 reveal mild-moderate tricompartmental degenerative changes    Patient Stated Goals Need to be able to squat and lift for work. Able to climb stairs. Longer bouts of standing on feet.    Currently in Pain? Yes    Pain Score 0-No pain    Pain Location Knee    Pain Orientation Right;Left    Pain Descriptors / Indicators Aching;Sharp    Pain Type Chronic pain    Pain Onset More than a month ago    Pain Frequency Intermittent    Aggravating Factors  Walking and Stairs.    Pain Relieving Factors Elevation and Ice    Effect of Pain on Daily Activities reduced lifting tolerance at work and short walking distances then optimal.                [facility] PT Assessment - 04/24/21 0001       Assessment   Medical Diagnosis Primary osteoarthritis of right and left  knee    Referring Provider (PT) Joseph Berkshire, MD    Onset Date/Surgical Date 01/04/21    Hand Dominance Right    Next MD Visit 05/02/2021    Prior Therapy Long time ago for shoulder      Precautions   Precautions None  Restrictions   Weight Bearing Restrictions No      Balance Screen   Has the patient fallen in the past 6 months No      Home Environment   Living Environment Private residence      Prior Function   Level of Independence Independent               SUBJECTIVE Chief complaint: Pt states onset of bilat knee pain in July 2022. Pt reports joined a gym in February 2022 and was going 3x a weeks with atleast 30 mins of treadmill activity. Pt has stopped going to the gym since July 2/2 to bilat knee pain. Pt reports injections in both knees on 04/16/2021. Pt states that the injections have greatly improved her sx. Pt states that at worse she was amb with a cane and only able to move short home-related distances.    History: PMH with h/o depression, BMI: 50-59.9, Injection 04/16/2021, and plantar fasciitis. Pt would like to return to [employer] for  independent exercise soon.   Referring Dx: Primary osteoarthritis of right and left  knee Referring Provider: Joseph Berkshire, MD Pain location: Bilat knees, more focal in the back of the knee compared to the front.  Pain: Present 0/10, Best 0/10, Worst 10/10: Pain quality: Ache, burning,  Radiating pain: Pt states that she has pinching in her R hip, does not know if it is related.  Numbness/Tingling: none  24 hour pain behavior: Activity related.  Aggravating factors: Walking, Stairs.  Easing factors: Elevation and Ice.  How long can you sit: WNL  How long can you stand: 10 mins  How long can you walk: 1hr  History of back or LE injury, pain, surgery, or therapy: None Follow-up appointment with MD: 05/02/2021. Possible pushing that back to understand response to PT.  Dominant hand: Right Imaging: x-rays dated 03/31/2021 reveal mild-moderate tricompartmental degenerative changes Falls in the last 6 months: None Occupational demands: Pt works in a [employer] with up to 13 hours. 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  Hobbies: Going events, and walking around.  Goals: Need to be able to squat and lift for work. Able to climb stair. Longer bouts of standing on feet.  Red flags (bowel/bladder changes, saddle paresthesia, personal history of cancer, chills/fever, night sweats, unrelenting pain, first onset of insidious LBP <20 y/o) Negative    OBJECTIVE   FOTO:l 52 with a predicted value of 70   Mental Status Patient is oriented to person, place and time.  Recent memory is intact.  Remote memory is intact.  Attention span and concentration are intact.  Expressive speech is intact.  Patient's fund of knowledge is within normal limits for educational level.  SENSATION: Grossly intact to light touch bilateral LEs as determined by testing dermatomes L2-S2    MUSCULOSKELETAL: Tremor: None Bulk: Normal Tone: Normal No visible step-off along spinal  column  Posture Lumbar lordosis: WNL Iliac crest height: equal bilaterally Lumbar lateral shift: negative Lower crossed syndrome (tight hip flexors and erector spinae; weak gluts and abs): negative  Gait Grossly WNL with mild bilat toe out and bilat trendelenburg.     Palpation - TtP at bilat pes anserine and Post knee L>R. No joint line tenderness at the time of initial evaluation.      Strength (out of 5) R/L 4/4 Hip flexion 4/4 Hip ER 5/5 Hip IR 5/5 Hip abduction 5/5 Hip adduction 4/4 Knee extension 4/4 Knee flexion *  pain with L knee flexion* 5/5 Ankle dorsiflexion    *Indicates pain   AROM (degrees) R/L (all movements include overpressure unless otherwise stated)  Hip Flexion (0-125): WFL Hip Abduction (0-40): WFL Knee Flexion (0-135): R: 125 L: 125 soft end feel bilat 2/2 soft tissue approximation.   Knee Extension (0): R:0 L: 0 Ankle DF (0-35) WNL  Ankle PL (0-50) WNL  *Indicates pain   PROM (degrees) = to AROM.       Muscle Length Hamstrings: R:0 degrees L: lacking 24  degrees       SPECIAL TESTS Lumbar Radiculopathy and Discogenic: Slump (SN 83, -LR 0.32): neg bilat   SLR (SN 92, -LR 0.29): neg bilat   Crossed SLR (SP 90): neg bilat      Hip: FABER (SN 81):  neg bilat  FADIR (SN 94): R:  neg bilat    Piriformis Syndrome: FAIR Test (SN 88, SP 83): neg bilat   Functional Tasks Lifting: Pt was able to lift 17 # box with proper form with verbal cueing. Pt states quad fatigue after 10 reps.   Squatting: Pt displays knees over toes during first set of 5. Pt states minor ant knee pain. Pt was able to complete squat with correct form and no pain with mild-mod verbal cueing.   Sit to stand: WNL  Stairs: 4 steps x3 without rails. Slight increase in pain in the R knee compared to the L. Slight toe out of L foot compared to R.    HEP  Access Code: [email or web address] URL: [email or web address] Date: 04/24/2021 Prepared by: Dorene Grebe  Exercises   Standing Hip Abduction with Counter Support - 1 x daily - 5 x weekly - 3 sets - 10 reps  Standing Knee Flexion with Counter Support - 1 x daily - 5 x weekly - 3 sets - 10 reps  Heel Raises with Counter Support - 1 x daily - 5 x weekly - 3 sets - 10 reps  Squat with Chair Touch - 1 x daily - 5 x weekly - 3 sets - 10 reps       PT Education - 04/24/21 1700     Education Details See HEP    Person(s) Educated Patient    Methods Explanation;Demonstration;Handout    Comprehension Verbalized understanding;Returned demonstration              PT Short Term Goals - 04/24/21 1508       PT SHORT TERM GOAL #1   Title Pt reports appropirate return to public gym at least 2x a week without increase in abnormal pain or soreness in bilat LE.    Baseline Pt is currently not activity engage in gym based exercises 2/2 to fear other additional bilat knee pain flare up.    Time 4    Period Weeks    Status New    Target Date 05/22/21               PT Long Term Goals - 04/24/21 1519       PT LONG TERM GOAL #1   Title Pt will obtained a FOTO score of 70 to measure self-reported improvement during functional ADLs    Baseline 52    Time 8    Period Weeks    Status New    Target Date 06/19/21      PT LONG TERM GOAL #2   Title Pt will increase strength of by at least 1/2 MMT grade in order to demonstrate improvement  in strength and function.    Baseline MMT(R/L): 4/4 Hip flexion. 4/4 Hip ER. 5/5 Hip IR. 5/5 Hip abduction. 5/5 Hip adduction. 4/4 Knee extension.  4/4 Knee flexion * pain with L knee flexion*. 5/5 Ankle dorsiflexion.    Time 8    Period Weeks    Status New    Target Date 06/19/21      PT LONG TERM GOAL #3   Title Pt will be independent with HEP in order to decrease worse bilat knee pain by atleast 4/10 NPS in order to improve pain-free function at home and work.    Baseline Worse pain 10/10    Time 8    Period Weeks    Status New    Target Date  06/19/21      PT LONG TERM GOAL #4   Title Pt will be able to perfrom a box lift of atleast 30# with proper form and pain less than 2/10 10 times.    Baseline Pt was able to lift 17 # box with proper form with verbal cueing.    Time 8    Period Weeks    Status New    Target Date 06/19/21      PT LONG TERM GOAL #5   Title Pt will be able to complete at least 12 stairs without knee pain and proper form to allow stair amb for work related tasks    Baseline 4 steps x3 without rails. Slight increase in pain in the R knee compared to the L. Slight toe out of L foot compared to R.    Time 8    Period Weeks    Status New    Target Date 06/19/21                Plan - 04/24/21 1504     Clinical Impression Statement Pt is a pleasant 45 year-old female referred for: Primary osteoarthritis of right and left  knee.  PT examination reveals the following deficits: NPS: best/current pain 0/10, worse pain 10/10. FOTO: 52 with a predicted improvement value of 70. LE MMT(R/L): 4/4 Hip flexion. 4/4 Hip ER. 5/5 Hip IR. 5/5 Hip abduction. 5/5 Hip adduction. 4/4 Knee extension.  4/4 Knee flexion * pain with L knee flexion*. 5/5 Ankle dorsiflexion. Lifting: Pt was able to lift 17 # box with proper form with verbal cueing. Pt states quad fatigue after 10 reps. Stairs: 4 steps x3 without rails. Slight increase in pain in the R knee compared to the L. Slight toe out of L foot compared to R. Pt case displays as moderate complexity with good prognosis for optimal return to PLOF due to time from onset, current level of daily physically activity, PMH, and PLOF. Pt will benefit from skilled PT 2x/week for 8 weeks to address deficits and promote safe independence with community and home related mobility and ADLs.    Personal Factors and Comorbidities Comorbidity 3+;Past/Current Experience;Profession    Comorbidities see PMH    Examination-Activity Limitations Stairs;Stand;Transfers;Locomotion Level;Squat;Lift     Examination-Participation Restrictions Occupation    Stability/Clinical Decision Making Evolving/Moderate complexity    Clinical Decision Making Moderate    Rehab Potential Good    PT Frequency 2x / week    PT Duration 8 weeks    PT Treatment/Interventions Aquatic Therapy;Biofeedback;Electrical Stimulation;Traction;Moist Heat;Ultrasound;Gait training;Stair training;Functional mobility training;Therapeutic activities;Therapeutic exercise;Balance training;Neuromuscular re-education;Passive range of motion;Dry needling;Energy conservation;Joint Manipulations;Spinal Manipulations    PT Next Visit Plan Reassess HEP adherence    PT Home Exercise  Plan [email or web address]    Recommended Other Services Not at this time. Pool based exercises will be recommended during POC    Consulted and Agree with Plan of Care Patient             Patient will benefit from skilled therapeutic intervention in order to improve the following deficits and impairments:  Abnormal gait, Decreased balance, Decreased activity tolerance, Decreased endurance, Decreased coordination, Decreased mobility, Decreased range of motion, Hypomobility, Decreased strength, Impaired flexibility, Improper body mechanics, Pain, Obesity, Postural dysfunction  Visit Diagnosis: Chronic pain of right knee  Chronic pain of left knee  Bilateral primary osteoarthritis of knee  Impaired strength of lower extremity     Problem List Patient Active Problem List   Diagnosis Date Noted   Primary osteoarthritis of right knee 04/10/2021   Primary osteoarthritis of left knee 04/10/2021   BMI 50.0-59.9, adult (HCC) 04/10/2021   Plantar fasciitis 10/23/2020   Allergic rhinitis 05/24/2019   Allergy 05/24/2019   Depo-Provera contraceptive status 05/24/2019   Depression 05/24/2019   Cammie Mcgee, PT, DPT # [id number] Lawernce Ion, SPT 04/24/2021, 5:06 PM  [facility] [address] [place], [place],  [address] Phone: [phone]   Fax:  [phone]  Name: LETINA LUCKETT MRN: [id number] Date of Birth: Jun 11, 1976

## 2021-04-24 NOTE — Patient Instructions (Signed)
Access Code: [email or web address] URL: [email or web address] Date: 04/24/2021 Prepared by: Dorene Grebe  Exercises  Standing Hip Abduction with Counter Support - 1 x daily - 5 x weekly - 3 sets - 10 reps Standing Knee Flexion with Counter Support - 1 x daily - 5 x weekly - 3 sets - 10 reps Heel Raises with Counter Support - 1 x daily - 5 x weekly - 3 sets - 10 reps Squat with Chair Touch - 1 x daily - 5 x weekly - 3 sets - 10 reps

## 2021-04-30 ENCOUNTER — Ambulatory Visit: Payer: PRIVATE HEALTH INSURANCE

## 2021-04-30 ENCOUNTER — Other Ambulatory Visit: Payer: Self-pay

## 2021-04-30 ENCOUNTER — Encounter: Payer: Self-pay | Admitting: Physical Therapy

## 2021-04-30 DIAGNOSIS — M25561 Pain in right knee: Secondary | ICD-10-CM | POA: Diagnosis not present

## 2021-04-30 DIAGNOSIS — R29898 Other symptoms and signs involving the musculoskeletal system: Secondary | ICD-10-CM

## 2021-04-30 DIAGNOSIS — G8929 Other chronic pain: Secondary | ICD-10-CM

## 2021-04-30 DIAGNOSIS — M170 Bilateral primary osteoarthritis of knee: Secondary | ICD-10-CM

## 2021-04-30 NOTE — Therapy (Addendum)
[facility] [address] [place], [place], [address] Phone: [phone]   Fax:  [phone]  Physical Therapy Treatment  Patient Details  Name: Tammie Grimes MRN: [id number] Date of Birth: 26-Aug-1975 Referring Provider (PT): Joseph Berkshire, MD   Encounter Date: 04/30/2021   PT End of Session - 04/30/21 1610     Visit Number 2    Number of Visits 16    Date for PT Re-Evaluation 06/19/21    Authorization Type First 6 visits authorization    Authorization Time Period First 6 visits authorization    Authorization - Visit Number 2    Authorization - Number of Visits 10    Progress Note Due on Visit 10    PT Start Time 1601    PT Stop Time 1646    PT Time Calculation (min) 45 min    Activity Tolerance Patient tolerated treatment well;Patient limited by fatigue;No increased pain    Behavior During Therapy WFL for tasks assessed/performed             Past Medical History:  Diagnosis Date   Allergy     History reviewed. No pertinent surgical history.  There were no vitals filed for this visit.   Subjective Assessment - 04/30/21 1607     Subjective Pt presents with 0/10 pain at the start of tx. Pt states mild-mod pain with the standing hamstring curls during the HEP and marked increased in quad soreness after STS. 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 Pt stated that she did not have need pain and but poor ability to squat 2/2 to bilat quad weakness.    Pertinent History PMH with h/o depression, BMI: 50-59.9, Injection 04/16/2021, and plantar fasciitis. Pt would like to return to [employer] for independent exercise soon.    Limitations Walking;Lifting;Standing    How long can you sit comfortably? WNL    How long can you stand comfortably? 10 mins    How long can you walk comfortably? 1 hour    Diagnostic tests x-rays dated 03/31/2021 reveal mild-moderate  tricompartmental degenerative changes    Patient Stated Goals Need to be able to squat and lift for work. Able to climb stairs. Longer bouts of standing on feet.    Currently in Pain? No/denies    Pain Score 0-No pain    Pain Onset More than a month ago               Treatment: Therapeutic Exercise:   NuStep L4 at position 9 without UE assist. Verbal cueing and pt education given for proper machine set up and LE alignment for maximal safety to warm up for the bilat knees. 5 minutes   12'' toe taps 1 mins x3. Pt stated marked hip flexor fatigue each round. Verbal and contact cueing to facilitate optimal tissue loading and correct biomechanical alignment to ensure efficient and safe movement.    Seated LAQ with 5# ankle weights bilat x30 each leg. Verbal and contact cueing to facilitate optimal tissue loading and correct biomechanical alignment to ensure efficient and safe movement.    STS with 8# weight ball at chest level 2x8. Pt displayed marked quad fatigue and decrease in optimal mechanics at the end of each set. Verbal and contact cueing to facilitate optimal tissue loading and correct biomechanical alignment to ensure efficient and safe movement.   Manual Therapy: 16 min total  IASTM:  Hypervolt was attempted, however, pt did not prefer the way it made her leg feel.   Green thera-roller was used to [employer]. Moderate pressure from distal to proximal with verbal cueing to ensure pressure within pt tolerance.     PT Education - 04/30/21 2052     Education Details form/technique with exercise    Person(s) Educated Patient    Methods Explanation;Demonstration    Comprehension Verbalized understanding;Returned demonstration              PT Short Term Goals - 04/24/21 1508       PT SHORT TERM GOAL #1   Title Pt reports appropirate return to public gym at least 2x a week without increase in abnormal pain or soreness in bilat LE.    Baseline Pt is currently  not activity engage in gym based exercises 2/2 to fear other additional bilat knee pain flare up.    Time 4    Period Weeks    Status New    Target Date 05/22/21               PT Long Term Goals - 04/24/21 1519       PT LONG TERM GOAL #1   Title Pt will obtained a FOTO score of 70 to measure self-reported improvement during functional ADLs    Baseline 52    Time 8    Period Weeks    Status New    Target Date 06/19/21      PT LONG TERM GOAL #2   Title Pt will increase strength of by at least 1/2 MMT grade in order to demonstrate improvement in strength and function.    Baseline MMT(R/L): 4/4 Hip flexion. 4/4 Hip ER. 5/5 Hip IR. 5/5 Hip abduction. 5/5 Hip adduction. 4/4 Knee extension.  4/4 Knee flexion * pain with L knee flexion*. 5/5 Ankle dorsiflexion.    Time 8    Period Weeks    Status New    Target Date 06/19/21      PT LONG TERM GOAL #3   Title Pt will be independent with HEP in order to decrease worse bilat knee pain by atleast 4/10 NPS in order to improve pain-free function at home and work.    Baseline Worse pain 10/10    Time 8    Period Weeks    Status New    Target Date 06/19/21      PT LONG TERM GOAL #4   Title Pt will be able to perfrom a box lift of atleast 30# with proper form and pain less than 2/10 10 times.    Baseline Pt was able to lift 17 # box with proper form with verbal cueing.    Time 8    Period Weeks    Status New    Target Date 06/19/21      PT LONG TERM GOAL #5   Title Pt will be able to complete at least 12 stairs without knee pain and proper form to allow stair amb for work related tasks    Baseline 4 steps x3 without rails. Slight increase in pain in the R knee compared to the L. Slight toe out of L foot compared to R.    Time 8    Period Weeks    Status New    Target Date 06/19/21                   Plan - 04/30/21 1611  Clinical Impression Statement Today's tx was focused on increased LE strengthening through  open and closed chained therex. Pt stated marked increased in quad fatigue and soreness after therex that reduced to baseline post manual intervention. Pt continues to display mark bilat LE endurance, strength, and ROM deficits resulting in decreased safe home/community mobiity, increased pain, and limited access to QOL. Pt. will continue to benefit from skilled physical therapy to progress POC to address remaining deficits to facilitate maximum functional capacity for optimal personal health and wellness for ADLs.    Personal Factors and Comorbidities Comorbidity 3+;Past/Current Experience;Profession    Comorbidities see PMH    Examination-Activity Limitations Stairs;Stand;Transfers;Locomotion Level;Squat;Lift    Examination-Participation Restrictions Occupation    Stability/Clinical Decision Making Evolving/Moderate complexity    Clinical Decision Making Moderate    Rehab Potential Good    PT Frequency 2x / week    PT Duration 8 weeks    PT Treatment/Interventions Aquatic Therapy;Biofeedback;Electrical Stimulation;Traction;Moist Heat;Ultrasound;Gait training;Stair training;Functional mobility training;Therapeutic activities;Therapeutic exercise;Balance training;Neuromuscular re-education;Passive range of motion;Dry needling;Energy conservation;Joint Manipulations;Spinal Manipulations    PT Next Visit Plan Reassess HEP adherence    PT Home Exercise Plan [email or web address]    Consulted and Agree with Plan of Care Patient             Patient will benefit from skilled therapeutic intervention in order to improve the following deficits and impairments:  Abnormal gait, Decreased balance, Decreased activity tolerance, Decreased endurance, Decreased coordination, Decreased mobility, Decreased range of motion, Hypomobility, Decreased strength, Impaired flexibility, Improper body mechanics, Pain, Obesity, Postural dysfunction  Visit Diagnosis: Chronic pain of right knee  Chronic pain of left  knee  Bilateral primary osteoarthritis of knee  Impaired strength of lower extremity     Problem List Patient Active Problem List   Diagnosis Date Noted   Primary osteoarthritis of right knee 04/10/2021   Primary osteoarthritis of left knee 04/10/2021   BMI 50.0-59.9, adult (HCC) 04/10/2021   Plantar fasciitis 10/23/2020   Allergic rhinitis 05/24/2019   Allergy 05/24/2019   Depo-Provera contraceptive status 05/24/2019   Depression 05/24/2019   Lawernce Ion, SPT  Delphia Grates. Fairly IV, PT, DPT Physical Therapist- [place]  [facility]  04/30/2021, 8:56 PM  [place] [facility] [address] [place], [place], [address] Phone: [phone]   Fax:  [phone]  Name: Tammie Grimes MRN: [id number] Date of Birth: October 30, 1975

## 2021-05-02 ENCOUNTER — Ambulatory Visit: Payer: PRIVATE HEALTH INSURANCE | Admitting: Family Medicine

## 2021-05-02 ENCOUNTER — Ambulatory Visit: Payer: PRIVATE HEALTH INSURANCE | Admitting: Physical Therapy

## 2021-05-02 ENCOUNTER — Other Ambulatory Visit: Payer: Self-pay

## 2021-05-02 ENCOUNTER — Encounter: Payer: Self-pay | Admitting: Physical Therapy

## 2021-05-02 DIAGNOSIS — G8929 Other chronic pain: Secondary | ICD-10-CM

## 2021-05-02 DIAGNOSIS — M25561 Pain in right knee: Secondary | ICD-10-CM | POA: Diagnosis not present

## 2021-05-02 DIAGNOSIS — M25562 Pain in left knee: Secondary | ICD-10-CM

## 2021-05-02 DIAGNOSIS — M170 Bilateral primary osteoarthritis of knee: Secondary | ICD-10-CM

## 2021-05-02 DIAGNOSIS — R29898 Other symptoms and signs involving the musculoskeletal system: Secondary | ICD-10-CM

## 2021-05-02 NOTE — Therapy (Signed)
[facility] [address] [place], [place], [address] Phone: [phone]   Fax:  [phone]  Physical Therapy Treatment  Patient Details  Name: Tammie Grimes MRN: [id number] Date of Birth: [birth year] Referring Provider (PT): Joseph Berkshire, MD   Encounter Date: 05/02/2021   PT End of Session - 05/02/21 1610     Visit Number 3    Number of Visits 16    Date for PT Re-Evaluation 06/19/21    Authorization Type First 6 visits authorization    Authorization Time Period First 6 visits authorization    Authorization - Visit Number 3    Authorization - Number of Visits 10    Progress Note Due on Visit 10    PT Start Time 1605    PT Stop Time 1646    PT Time Calculation (min) 41 min    Activity Tolerance Patient tolerated treatment well;Patient limited by fatigue;No increased pain    Behavior During Therapy WFL for tasks assessed/performed             Past Medical History:  Diagnosis Date   Allergy     History reviewed. No pertinent surgical history.  There were no vitals filed for this visit.   Subjective Assessment - 05/02/21 1608     Subjective Pt presents to tx with 1-2/10 pain at the start of tx. Pt states mod soreness after last tx without poor adherence to ADLs. Pt states good adherence to HEP.    Pertinent History PMH with h/o depression, BMI: 50-59.9, Injection 04/16/2021, and plantar fasciitis. Pt would like to return to [employer] for independent exercise soon.    Limitations Walking;Lifting;Standing    How long can you sit comfortably? WNL    How long can you stand comfortably? 10 mins    How long can you walk comfortably? 1 hour    Diagnostic tests x-rays dated 03/31/2021 reveal mild-moderate tricompartmental degenerative changes    Patient Stated Goals Need to be able to squat and lift for work. Able to climb stairs. Longer bouts of standing on feet.    Currently in Pain? Yes    Pain Score 2      Pain Location Knee    Pain Orientation Right;Left    Pain Onset More than a month ago               Treatment: Therapeutic Exercise:   NuStep L4 at position 9 without UE assist. Verbal cueing and pt education given for proper machine set up and LE alignment for maximal safety to warm up for the bilat knees. 10 mins    // Bars with only occ light toch assist and bilat 5# ankle weights donned. Verbal and contact cueing to facilitate optimal tissue loading and correct biomechanical alignment to ensure efficient and safe movement.  1) hip marching  2) hamstring curl walking  3) lateral walking  4) backwards walking  5) walking lunges    Seated LAQ with 5# ankle weights bilat x30 each leg. Verbal and contact cueing to facilitate optimal tissue loading and correct biomechanical alignment to ensure efficient and safe movement.     Manual Therapy: 11 min total   IASTM:   Green thera-roller was used to [employer]. Moderate pressure from distal to proximal with verbal cueing to ensure pressure within pt tolerance.            PT Short Term Goals - 04/24/21 1508       PT  SHORT TERM GOAL #1   Title Pt reports appropirate return to public gym at least 2x a week without increase in abnormal pain or soreness in bilat LE.    Baseline Pt is currently not activity engage in gym based exercises 2/2 to fear other additional bilat knee pain flare up.    Time 4    Period Weeks    Status New    Target Date 05/22/21               PT Long Term Goals - 04/24/21 1519       PT LONG TERM GOAL #1   Title Pt will obtained a FOTO score of 70 to measure self-reported improvement during functional ADLs    Baseline 52    Time 8    Period Weeks    Status New    Target Date 06/19/21      PT LONG TERM GOAL #2   Title Pt will increase strength of by at least 1/2 MMT grade in order to demonstrate improvement in strength and function.    Baseline MMT(R/L): 4/4 Hip flexion. 4/4  Hip ER. 5/5 Hip IR. 5/5 Hip abduction. 5/5 Hip adduction. 4/4 Knee extension.  4/4 Knee flexion * pain with L knee flexion*. 5/5 Ankle dorsiflexion.    Time 8    Period Weeks    Status New    Target Date 06/19/21      PT LONG TERM GOAL #3   Title Pt will be independent with HEP in order to decrease worse bilat knee pain by atleast 4/10 NPS in order to improve pain-free function at home and work.    Baseline Worse pain 10/10    Time 8    Period Weeks    Status New    Target Date 06/19/21      PT LONG TERM GOAL #4   Title Pt will be able to perfrom a box lift of atleast 30# with proper form and pain less than 2/10 10 times.    Baseline Pt was able to lift 17 # box with proper form with verbal cueing.    Time 8    Period Weeks    Status New    Target Date 06/19/21      PT LONG TERM GOAL #5   Title Pt will be able to complete at least 12 stairs without knee pain and proper form to allow stair amb for work related tasks    Baseline 4 steps x3 without rails. Slight increase in pain in the R knee compared to the L. Slight toe out of L foot compared to R.    Time 8    Period Weeks    Status New    Target Date 06/19/21                   Plan - 05/02/21 1611     Clinical Impression Statement Today's tx was focused on dynamic LE strengthening with ankle weights. Pt tolerated all tx without increase in bilat knee pain and only mod bilat quad fatigue. Pt will be out of town next week and will suspend tx for 1 week. Pt continues to display mark LE/UE endurance, strength, and ROM deficits resulting in decreased safe home/community mobiity, increased pain, and limited access to QOL. Pt. will continue to benefit from skilled physical therapy to progress POC to address remaining deficits to facilitate maximum functional capacity for optimal personal health and wellness for ADLs.    Personal  Factors and Comorbidities Comorbidity 3+;Past/Current Experience;Profession    Comorbidities see PMH     Examination-Activity Limitations Stairs;Stand;Transfers;Locomotion Level;Squat;Lift    Examination-Participation Restrictions Occupation    Stability/Clinical Decision Making Evolving/Moderate complexity    Clinical Decision Making Moderate    Rehab Potential Good    PT Frequency 2x / week    PT Duration 8 weeks    PT Treatment/Interventions Aquatic Therapy;Biofeedback;Electrical Stimulation;Traction;Moist Heat;Ultrasound;Gait training;Stair training;Functional mobility training;Therapeutic activities;Therapeutic exercise;Balance training;Neuromuscular re-education;Passive range of motion;Dry needling;Energy conservation;Joint Manipulations;Spinal Manipulations    PT Next Visit Plan Possible change to 1x week POC    PT Home Exercise Plan [email or web address]    Consulted and Agree with Plan of Care Patient             Patient will benefit from skilled therapeutic intervention in order to improve the following deficits and impairments:  Abnormal gait, Decreased balance, Decreased activity tolerance, Decreased endurance, Decreased coordination, Decreased mobility, Decreased range of motion, Hypomobility, Decreased strength, Impaired flexibility, Improper body mechanics, Pain, Obesity, Postural dysfunction  Visit Diagnosis: Chronic pain of right knee  Chronic pain of left knee  Bilateral primary osteoarthritis of knee  Impaired strength of lower extremity     Problem List Patient Active Problem List   Diagnosis Date Noted   Primary osteoarthritis of right knee 04/10/2021   Primary osteoarthritis of left knee 04/10/2021   BMI 50.0-59.9, adult (HCC) 04/10/2021   Plantar fasciitis 10/23/2020   Allergic rhinitis 05/24/2019   Allergy 05/24/2019   Depo-Provera contraceptive status 05/24/2019   Depression 05/24/2019    Basilia Jumbo PT, DPT  Lawernce Ion, Student-PT 05/02/2021, 5:11 PM  [facility] [address] [place], [place],  [address] Phone: [phone]   Fax:  [phone]  Name: Tammie Grimes MRN: [id number] Date of Birth: Aug 09, 1975

## 2021-05-14 ENCOUNTER — Ambulatory Visit
Admission: RE | Admit: 2021-05-14 | Discharge: 2021-05-14 | Disposition: A | Discharge status: Home / Self Care | Payer: PRIVATE HEALTH INSURANCE | Source: Ambulatory Visit | Attending: Family Medicine | Admitting: Family Medicine

## 2021-05-14 ENCOUNTER — Other Ambulatory Visit: Payer: Self-pay

## 2021-05-14 ENCOUNTER — Encounter: Payer: Self-pay | Admitting: Physical Therapy

## 2021-05-14 ENCOUNTER — Ambulatory Visit: Payer: PRIVATE HEALTH INSURANCE | Attending: Family Medicine | Admitting: Physical Therapy

## 2021-05-14 DIAGNOSIS — Z1231 Encounter for screening mammogram for malignant neoplasm of breast: Secondary | ICD-10-CM | POA: Diagnosis present

## 2021-05-14 DIAGNOSIS — M25562 Pain in left knee: Secondary | ICD-10-CM | POA: Insufficient documentation

## 2021-05-14 DIAGNOSIS — G8929 Other chronic pain: Secondary | ICD-10-CM

## 2021-05-14 DIAGNOSIS — M170 Bilateral primary osteoarthritis of knee: Secondary | ICD-10-CM

## 2021-05-14 DIAGNOSIS — R29898 Other symptoms and signs involving the musculoskeletal system: Secondary | ICD-10-CM

## 2021-05-14 DIAGNOSIS — M25561 Pain in right knee: Secondary | ICD-10-CM | POA: Insufficient documentation

## 2021-05-14 NOTE — Therapy (Signed)
[place] [facility] [facility] [address] [place], [place], [address] Phone: [phone]   Fax:  [phone]  Physical Therapy Treatment  Patient Details  Name: Tammie Grimes MRN: [id number] Date of Birth: May 12, 1976 Referring Provider (PT): Rosette Reveal, MD   Encounter Date: 05/14/2021   PT End of Session - 05/14/21 0819     Visit Number 4    Number of Visits 16    Date for PT Re-Evaluation 06/19/21    Authorization Type First 6 visits authorization    Authorization Time Period First 6 visits authorization    Authorization - Visit Number 4    Authorization - Number of Visits 10    Progress Note Due on Visit 10    PT Start Time 0812    PT Stop Time 0858    PT Time Calculation (min) 46 min    Activity Tolerance Patient tolerated treatment well;Patient limited by fatigue;No increased pain    Behavior During Therapy WFL for tasks assessed/performed             Past Medical History:  Diagnosis Date   Allergy     History reviewed. No pertinent surgical history.  There were no vitals filed for this visit.   Subjective Assessment - 05/14/21 0814     Subjective Pt presents to tx with 2/10 bilat knee pain at the start of tx. Pt states a good vacation over the last week, however, she states poor adherence to exercise and diet during the vacation. Pt states that her condo was on the 3rd floor without an elevator which resulted increased knee pain during ascending/descending.    Pertinent History PMH with h/o depression, BMI: 50-59.9, Injection 04/16/2021, and plantar fasciitis. Pt would like to return to [employer] for independent exercise soon.    Limitations Walking;Lifting;Standing    How long can you sit comfortably? WNL    How long can you stand comfortably? 10 mins    How long can you walk comfortably? 1 hour    Diagnostic tests x-rays dated 03/31/2021 reveal mild-moderate tricompartmental degenerative changes    Patient Stated  Goals Need to be able to squat and lift for work. Able to climb stairs. Longer bouts of standing on feet.    Currently in Pain? Yes    Pain Score 2     Pain Location Knee    Pain Orientation Right;Left    Pain Descriptors / Indicators Aching;Sharp    Pain Type Chronic pain    Pain Onset More than a month ago                   Treatment: Therapeutic Exercise:   NuStep L4 at position 9 without UE assist. Verbal cueing and pt education given for proper machine set up and LE alignment for maximal safety to warm up for the bilat knees. 10 mins    // Bars with only occ light toch assist and bilat 5# ankle weights donned. Verbal and contact cueing to facilitate optimal tissue loading and correct biomechanical alignment to ensure efficient and safe movement.  1) hip marching *increase R pes anserine pain after 3rd lap*  2) lateral walking     Seated LAQ with 5# ankle weights bilat x30 each leg. Verbal and contact cueing to facilitate optimal tissue loading and correct biomechanical alignment to ensure efficient and safe movement.     Manual Therapy: 14 min total   IASTM:  Green thera-roller was used to [employer]. Moderate  pressure from distal to proximal with verbal cueing to ensure pressure within pt tolerance.   PT treatment was completed with Biofreeze over the R pes anserine after tx.         PT Short Term Goals - 04/24/21 1508       PT SHORT TERM GOAL #1   Title Pt reports appropirate return to public gym at least 2x a week without increase in abnormal pain or soreness in bilat LE.    Baseline Pt is currently not activity engage in gym based exercises 2/2 to fear other additional bilat knee pain flare up.    Time 4    Period Weeks    Status New    Target Date 05/22/21               PT Long Term Goals - 04/24/21 1519       PT LONG TERM GOAL #1   Title Pt will obtained a FOTO score of 70 to measure self-reported improvement during functional  ADLs    Baseline 52    Time 8    Period Weeks    Status New    Target Date 06/19/21      PT LONG TERM GOAL #2   Title Pt will increase strength of by at least 1/2 MMT grade in order to demonstrate improvement in strength and function.    Baseline MMT(R/L): 4/4 Hip flexion. 4/4 Hip ER. 5/5 Hip IR. 5/5 Hip abduction. 5/5 Hip adduction. 4/4 Knee extension.  4/4 Knee flexion * pain with L knee flexion*. 5/5 Ankle dorsiflexion.    Time 8    Period Weeks    Status New    Target Date 06/19/21      PT LONG TERM GOAL #3   Title Pt will be independent with HEP in order to decrease worse bilat knee pain by atleast 4/10 NPS in order to improve pain-free function at home and work.    Baseline Worse pain 10/10    Time 8    Period Weeks    Status New    Target Date 06/19/21      PT LONG TERM GOAL #4   Title Pt will be able to perfrom a box lift of atleast 30# with proper form and pain less than 2/10 10 times.    Baseline Pt was able to lift 17 # box with proper form with verbal cueing.    Time 8    Period Weeks    Status New    Target Date 06/19/21      PT LONG TERM GOAL #5   Title Pt will be able to complete at least 12 stairs without knee pain and proper form to allow stair amb for work related tasks    Baseline 4 steps x3 without rails. Slight increase in pain in the R knee compared to the L. Slight toe out of L foot compared to R.    Time 8    Period Weeks    Status New    Target Date 06/19/21                   Plan - 05/14/21 0820     Clinical Impression Statement Pt displayed good tolerance to tx with reintroduction of targeted therex. Pt states increased R pes anserine pain after walking marches. Pt states 0/10 pain post manual intervention and ice. PT treatment was completed with Biofreeze over the R pes anserine after tx. Pt continues to display mark  bilat LE endurance, strength, and ROM deficits resulting in decreased safe home/community mobiity, increased pain, and  limited access to QOL. Pt. will continue to benefit from skilled physical therapy to progress POC to address remaining deficits to facilitate maximum functional capacity for optimal personal health and wellness for ADLs.    Personal Factors and Comorbidities Comorbidity 3+;Past/Current Experience;Profession    Comorbidities see PMH    Examination-Activity Limitations Stairs;Stand;Transfers;Locomotion Level;Squat;Lift    Examination-Participation Restrictions Occupation    Stability/Clinical Decision Making Evolving/Moderate complexity    Clinical Decision Making Moderate    Rehab Potential Good    PT Frequency 2x / week    PT Duration 8 weeks    PT Treatment/Interventions Aquatic Therapy;Biofeedback;Electrical Stimulation;Traction;Moist Heat;Ultrasound;Gait training;Stair training;Functional mobility training;Therapeutic activities;Therapeutic exercise;Balance training;Neuromuscular re-education;Passive range of motion;Dry needling;Energy conservation;Joint Manipulations;Spinal Manipulations    PT Next Visit Plan Possible change to 1x week POC    PT Home Exercise Plan [id number]    Consulted and Agree with Plan of Care Patient             Patient will benefit from skilled therapeutic intervention in order to improve the following deficits and impairments:  Abnormal gait, Decreased balance, Decreased activity tolerance, Decreased endurance, Decreased coordination, Decreased mobility, Decreased range of motion, Hypomobility, Decreased strength, Impaired flexibility, Improper body mechanics, Pain, Obesity, Postural dysfunction  Visit Diagnosis: Chronic pain of right knee  Impaired strength of lower extremity  Chronic pain of left knee  Bilateral primary osteoarthritis of knee     Problem List Patient Active Problem List   Diagnosis Date Noted   Primary osteoarthritis of right knee 04/10/2021   Primary osteoarthritis of left knee 04/10/2021   BMI 50.0-59.9, adult ([place]) 04/10/2021    Plantar fasciitis 10/23/2020   Allergic rhinitis 05/24/2019   Allergy 05/24/2019   Depo-Provera contraceptive status 05/24/2019   Depression 05/24/2019   Pura Spice, PT, DPT # [id number] Fara Olden, SPT 05/14/2021, 11:27 AM  [facility] [place] [address] [place], [place], [address] Phone: [phone]   Fax:  [phone]  Name: Tammie Grimes MRN: [id number] Date of Birth: [birth year]

## 2021-05-16 ENCOUNTER — Other Ambulatory Visit: Payer: Self-pay

## 2021-05-16 ENCOUNTER — Ambulatory Visit: Payer: PRIVATE HEALTH INSURANCE | Admitting: Physical Therapy

## 2021-05-16 ENCOUNTER — Encounter: Payer: Self-pay | Admitting: Physical Therapy

## 2021-05-16 DIAGNOSIS — G8929 Other chronic pain: Secondary | ICD-10-CM | POA: Diagnosis present

## 2021-05-16 DIAGNOSIS — R29898 Other symptoms and signs involving the musculoskeletal system: Secondary | ICD-10-CM | POA: Diagnosis present

## 2021-05-16 DIAGNOSIS — M170 Bilateral primary osteoarthritis of knee: Secondary | ICD-10-CM

## 2021-05-16 DIAGNOSIS — M25562 Pain in left knee: Secondary | ICD-10-CM | POA: Diagnosis present

## 2021-05-16 DIAGNOSIS — M25561 Pain in right knee: Secondary | ICD-10-CM | POA: Diagnosis not present

## 2021-05-16 NOTE — Therapy (Signed)
[place] [facility] [address] [place], [place], [address] Phone: [phone]   Fax:  [phone]  Physical Therapy Treatment  Patient Details  Name: HA REINKING MRN: [id number] Date of Birth: Apr 27, 1976 Referring Provider (PT): Rosette Reveal, MD   Encounter Date: 05/16/2021   PT End of Session - 05/16/21 1305     Visit Number 5    Number of Visits 16    Date for PT Re-Evaluation 06/19/21    Authorization Type First 6 visits authorization    Authorization Time Period First 6 visits authorization    Authorization - Visit Number 5    Authorization - Number of Visits 10    Progress Note Due on Visit 10    PT Start Time 1301    PT Stop Time 1346    PT Time Calculation (min) 45 min    Activity Tolerance Patient tolerated treatment well;Patient limited by fatigue;No increased pain    Behavior During Therapy WFL for tasks assessed/performed             Past Medical History:  Diagnosis Date   Allergy     History reviewed. No pertinent surgical history.  There were no vitals filed for this visit.   Subjective Assessment - 05/16/21 1301     Subjective Pt states 0/10 bilat knee pain at the start of tx. Pt moderate stiffness at the start of the tx. Pt states LE cramping last night. Pt states that she drinks a lot of water, but is unfamiliar with electrolyte replacement.    Pertinent History PMH with h/o depression, BMI: 50-59.9, Injection 04/16/2021, and plantar fasciitis. Pt would like to return to [employer] for independent exercise soon.    Limitations Walking;Lifting;Standing    How long can you sit comfortably? WNL    How long can you stand comfortably? 10 mins    How long can you walk comfortably? 1 hour    Diagnostic tests x-rays dated 03/31/2021 reveal mild-moderate tricompartmental degenerative changes    Patient Stated Goals Need to be able to squat and lift for work. Able to climb stairs. Longer bouts of  standing on feet.    Currently in Pain? No/denies    Pain Score 0-No pain    Pain Onset More than a month ago              Treatment: Therapeutic Exercise:   NuStep L4 at position 9 without UE assist. Verbal cueing and pt education given for proper machine set up and LE alignment for maximal safety to warm up for the bilat knees. 10 mins    // Bars with only occ light toch assist and no ankle weights and 3 laps each movement. Verbal and contact cueing to facilitate optimal tissue loading and correct biomechanical alignment to ensure efficient and safe movement. *pt displayed marked fatigue and cramping at the end of each bouts of 3 laps*  1) Hamstring curl walking 2) lateral walking  3) marching     Manual Therapy: 23 min total   IASTM:  Green thera-roller was used to [employer]. Moderate pressure from distal to proximal with verbal cueing to ensure pressure within pt tolerance.   Grade 3 mob, 30 sec x 3,  to bilat tibiofemoral and patellar joints. Verbal cueing to ensure that pressure was within pt tolerance. Position was supine with HOB elevated 60 degs and green bolster under bilat knees.  PT Education - 05/16/21 1303     Education Details Pt was educated on proper hydration and electrolyte replacement.    Person(s) Educated Patient    Methods Explanation    Comprehension Verbalized understanding              PT Short Term Goals - 04/24/21 1508       PT SHORT TERM GOAL #1   Title Pt reports appropirate return to public gym at least 2x a week without increase in abnormal pain or soreness in bilat LE.    Baseline Pt is currently not activity engage in gym based exercises 2/2 to fear other additional bilat knee pain flare up.    Time 4    Period Weeks    Status New    Target Date 05/22/21               PT Long Term Goals - 04/24/21 1519       PT LONG TERM GOAL #1   Title Pt will obtained a FOTO score of 70 to measure  self-reported improvement during functional ADLs    Baseline 52    Time 8    Period Weeks    Status New    Target Date 06/19/21      PT LONG TERM GOAL #2   Title Pt will increase strength of by at least 1/2 MMT grade in order to demonstrate improvement in strength and function.    Baseline MMT(R/L): 4/4 Hip flexion. 4/4 Hip ER. 5/5 Hip IR. 5/5 Hip abduction. 5/5 Hip adduction. 4/4 Knee extension.  4/4 Knee flexion * pain with L knee flexion*. 5/5 Ankle dorsiflexion.    Time 8    Period Weeks    Status New    Target Date 06/19/21      PT LONG TERM GOAL #3   Title Pt will be independent with HEP in order to decrease worse bilat knee pain by atleast 4/10 NPS in order to improve pain-free function at home and work.    Baseline Worse pain 10/10    Time 8    Period Weeks    Status New    Target Date 06/19/21      PT LONG TERM GOAL #4   Title Pt will be able to perfrom a box lift of atleast 30# with proper form and pain less than 2/10 10 times.    Baseline Pt was able to lift 17 # box with proper form with verbal cueing.    Time 8    Period Weeks    Status New    Target Date 06/19/21      PT LONG TERM GOAL #5   Title Pt will be able to complete at least 12 stairs without knee pain and proper form to allow stair amb for work related tasks    Baseline 4 steps x3 without rails. Slight increase in pain in the R knee compared to the L. Slight toe out of L foot compared to R.    Time 8    Period Weeks    Status New    Target Date 06/19/21                   Plan - 05/16/21 1306     Clinical Impression Statement Today's tx was focused on reduction of bilat LE soreness and cramping. Pt was educated on proper hydration and electrolyte replacement to allow greatly activity tolerance. Pt states marked reduction in LE soreness and stiffness  post manual intervention. Pt continues to display mark LE/UE endurance, strength, and ROM deficits resulting in decreased safe home/community  mobiity, increased pain, and limited access to QOL. Pt. will continue to benefit from skilled physical therapy to progress POC to address remaining deficits to facilitate maximum functional capacity for optimal personal health and wellness for ADLs.    Personal Factors and Comorbidities Comorbidity 3+;Past/Current Experience;Profession    Comorbidities see PMH    Examination-Activity Limitations Stairs;Stand;Transfers;Locomotion Level;Squat;Lift    Examination-Participation Restrictions Occupation    Stability/Clinical Decision Making Evolving/Moderate complexity    Clinical Decision Making Moderate    Rehab Potential Good    PT Frequency 2x / week    PT Duration 8 weeks    PT Treatment/Interventions Aquatic Therapy;Biofeedback;Electrical Stimulation;Traction;Moist Heat;Ultrasound;Gait training;Stair training;Functional mobility training;Therapeutic activities;Therapeutic exercise;Balance training;Neuromuscular re-education;Passive range of motion;Dry needling;Energy conservation;Joint Manipulations;Spinal Manipulations    PT Next Visit Plan POC is 1x week    PT Home Exercise Plan [email or web address]    Consulted and Agree with Plan of Care Patient             Patient will benefit from skilled therapeutic intervention in order to improve the following deficits and impairments:  Abnormal gait, Decreased balance, Decreased activity tolerance, Decreased endurance, Decreased coordination, Decreased mobility, Decreased range of motion, Hypomobility, Decreased strength, Impaired flexibility, Improper body mechanics, Pain, Obesity, Postural dysfunction  Visit Diagnosis: Chronic pain of right knee  Impaired strength of lower extremity  Chronic pain of left knee  Bilateral primary osteoarthritis of knee     Problem List Patient Active Problem List   Diagnosis Date Noted   Primary osteoarthritis of right knee 04/10/2021   Primary osteoarthritis of left knee 04/10/2021   BMI 50.0-59.9, adult (HCC)  04/10/2021   Plantar fasciitis 10/23/2020   Allergic rhinitis 05/24/2019   Allergy 05/24/2019   Depo-Provera contraceptive status 05/24/2019   Depression 05/24/2019   Cammie Mcgee, PT, DPT # [id number] Lawernce Ion, SPT 05/17/2021, 2:42 PM  [facility] [address] [place], [place], [address] Phone: [phone]   Fax:  [phone]  Name: FERNE ELLINGWOOD MRN: [id number] Date of Birth: [birth year]

## 2021-05-20 ENCOUNTER — Ambulatory Visit: Payer: PRIVATE HEALTH INSURANCE | Admitting: Physical Therapy

## 2021-05-20 ENCOUNTER — Ambulatory Visit (INDEPENDENT_AMBULATORY_CARE_PROVIDER_SITE_OTHER): Payer: PRIVATE HEALTH INSURANCE | Admitting: Family Medicine

## 2021-05-20 ENCOUNTER — Other Ambulatory Visit: Payer: Self-pay | Admitting: Family Medicine

## 2021-05-20 ENCOUNTER — Encounter: Payer: Self-pay | Admitting: Family Medicine

## 2021-05-20 ENCOUNTER — Other Ambulatory Visit: Payer: Self-pay

## 2021-05-20 VITALS — BP 102/72 | HR 91 | Ht 65.0 in | Wt 312.0 lb

## 2021-05-20 DIAGNOSIS — F32A Depression, unspecified: Secondary | ICD-10-CM | POA: Diagnosis not present

## 2021-05-20 DIAGNOSIS — M1712 Unilateral primary osteoarthritis, left knee: Secondary | ICD-10-CM | POA: Diagnosis not present

## 2021-05-20 DIAGNOSIS — Z23 Encounter for immunization: Secondary | ICD-10-CM | POA: Insufficient documentation

## 2021-05-20 DIAGNOSIS — Z13220 Encounter for screening for lipoid disorders: Secondary | ICD-10-CM

## 2021-05-20 DIAGNOSIS — R928 Other abnormal and inconclusive findings on diagnostic imaging of breast: Secondary | ICD-10-CM

## 2021-05-20 DIAGNOSIS — Z6841 Body Mass Index (BMI) 40.0 and over, adult: Secondary | ICD-10-CM

## 2021-05-20 DIAGNOSIS — M1711 Unilateral primary osteoarthritis, right knee: Secondary | ICD-10-CM

## 2021-05-20 DIAGNOSIS — N6320 Unspecified lump in the left breast, unspecified quadrant: Secondary | ICD-10-CM

## 2021-05-20 DIAGNOSIS — N6310 Unspecified lump in the right breast, unspecified quadrant: Secondary | ICD-10-CM

## 2021-05-20 MED ORDER — DULOXETINE HCL 30 MG PO CPEP: ORAL_CAPSULE | ORAL | 0 refills | Status: DC

## 2021-05-20 NOTE — Assessment & Plan Note (Signed)
Current BMI Body mass index is 51.92 kg/m.  Given comorbid significant bilateral knee pain with underlying osteoarthritis, plan for risk stratification labs, initiation of Rybelsus starter sample kit x1 month, continued focus on diet and exercise which is demonstrated steady weight loss, and a referral to general surgery/bariatrics for further evaluation and management options.

## 2021-05-20 NOTE — Assessment & Plan Note (Signed)
Chronic issue with ongoing symptomatology, at the last visit she did tolerate corticosteroid injections which provided significant and near total pain control x 3-4 weeks for the right knee, left knee is still noting mild improvement from injections.  That being said, pain has begun to return, right greater than left.  This is in the setting of BMI 51.92, underlying osteoarthritis.  I have reviewed additional management strategies and she is amenable to initiation of duloxetine with plan to titrate in 2 weeks to 60 mg, scheduled right knee genicular nerve block.  If the block is beneficial, a referral to pain and spine for nerve ablation to be coordinated.  We will check on the status of viscosupplementation and patient will continue physical therapy, home exercises, diclofenac, start as needed acetaminophen, and weight loss strategies over the interim.

## 2021-05-20 NOTE — Patient Instructions (Addendum)
-   Obtain fasting labs with orders provided - Start duloxetine 30 mg capsules nightly, after 2 weeks increase to 2 capsules nightly (60 mg total) - Start Rybelsus with Rx sample until follow-up - Continue diclofenac on an as-needed basis, formal physical therapy, home exercises -Can dose OTC Tylenol (acetaminophen) for additional pain control, do not exceed 4000 mg/day - Return for follow-up in 4 weeks for genicular nerve block - Contact our office for any questions between now and then

## 2021-05-20 NOTE — Assessment & Plan Note (Signed)
Annual influenza vaccine administered today. 

## 2021-05-20 NOTE — Assessment & Plan Note (Signed)
Depression screen [facility] 2/9 05/20/2021 04/16/2021 04/10/2021 03/28/2021  Decreased Interest 0 0 1 0  Down, Depressed, Hopeless 2 1 1 1   PHQ - 2 Score 2 1 2 1   Altered sleeping 1 0 0 2  Tired, decreased energy 1 0 1 1  Change in appetite 1 0 1 0  Feeling bad or failure about yourself  0 0 1 0  Trouble concentrating 0 0 0 0  Moving slowly or fidgety/restless 3 1 3 3   Suicidal thoughts 0 0 0 0  PHQ-9 Score 8 2 8 7   Difficult doing work/chores Extremely dIfficult Somewhat difficult Extremely dIfficult Extremely dIfficult   GAD 7 : Generalized Anxiety Score 05/20/2021 04/16/2021 04/10/2021 03/28/2021  Nervous, Anxious, on Edge 1 0 0 1  Control/stop worrying 1 0 1 1  Worry too much - different things 2 0 0 1  Trouble relaxing 1 0 0 2  Restless 0 0 0 1  Easily annoyed or irritable 0 0 0 1  Afraid - awful might happen 0 0 0 1  Total GAD 7 Score 5 0 1 8  Anxiety Difficulty Extremely difficult Not difficult at all Extremely difficult Extremely difficult   Reassuring and low PHQ and GAD scores, TSH and additional labs ordered.  We will will be initiating duloxetine primarily for chronic musculoskeletal pain, though this may provide adjunct mood control.

## 2021-05-20 NOTE — Progress Notes (Signed)
Primary Care / Sports Medicine Office Visit  Patient Information:  Patient ID: Tammie Grimes, female DOB: 1975-08-02 Age: 45 y.o. MRN: [id number]   Tammie Grimes is a pleasant 45 y.o. female presenting with the following:  Chief Complaint  Patient presents with   Knee Pain    Bilateral; right>left; cortisone injections 04/16/21 and lasted for about 3 weeks per patient; following with PT and doing home exercises; patient using cane with ambulation while at home; taking diclofenac tablets twice daily and diclofenac topical gel 2-3 times daily, and Tylenol as needed with minimal relief; 8/10 pain    Review of Systems pertinent details above   Patient Active Problem List   Diagnosis Date Noted   Flu vaccine need 05/20/2021   Primary osteoarthritis of right knee 04/10/2021   Primary osteoarthritis of left knee 04/10/2021   BMI 50.0-59.9, adult ([facility]) 04/10/2021   Plantar fasciitis 10/23/2020   Allergic rhinitis 05/24/2019   Allergy 05/24/2019   Depo-Provera contraceptive status 05/24/2019   Depression 05/24/2019   Past Medical History:  Diagnosis Date   Allergy    Outpatient Encounter Medications as of 05/20/2021  Medication Sig   5-Hydroxytryptophan (5-HTP) 100 MG CAPS Take 1 capsule by mouth daily.   ACETAMINOPHEN PO Take 1,000 mg by mouth every 6 (six) hours as needed.   diclofenac (VOLTAREN) 75 MG EC tablet Take 1 tablet (75 mg total) by mouth 2 (two) times daily.   diclofenac Sodium (VOLTAREN) 1 % GEL Apply 2 g topically 4 (four) times daily as needed.   DULoxetine (CYMBALTA) 30 MG capsule Take 1 capsule (30 mg total) by mouth every evening for 14 days, THEN 2 capsules (60 mg total) every evening for 16 days.   fluticasone (FLONASE) 50 MCG/ACT nasal spray Place 1 spray into both nostrils daily.   loratadine (CLARITIN) 10 MG tablet Take 10 mg by mouth daily.   montelukast (SINGULAIR) 10 MG tablet Take 10 mg by mouth at bedtime.   No facility-administered  encounter medications on file as of 05/20/2021.   No past surgical history on file.  Vitals:   05/20/21 1428  BP: 102/72  Pulse: 91  SpO2: 99%   Vitals:   05/20/21 1428  Weight: (!) 312 lb (141.5 kg)  Height: _0  (1.651 m)   Body mass index is 51.92 kg/m.  MM 3D SCREEN BREAST BILATERAL  Result Date: 05/15/2021 CLINICAL DATA:  Screening. EXAM: DIGITAL SCREENING BILATERAL MAMMOGRAM WITH TOMOSYNTHESIS AND CAD TECHNIQUE: Bilateral screening digital craniocaudal and mediolateral oblique mammograms were obtained. Bilateral screening digital breast tomosynthesis was performed. The images were evaluated with computer-aided detection. COMPARISON:  Previous exam(s). ACR Breast Density Category a: The breast tissue is almost entirely fatty. FINDINGS: In the right breast possible mass requires further evaluation. In the left breast possible mass requires further evaluation. IMPRESSION: Further evaluation is suggested for possible mass in the right breast. Further evaluation is suggested for possible mass in the left breast. RECOMMENDATION: Diagnostic mammogram and possibly ultrasound of both breasts. (Code:[id number]) The patient will be contacted regarding the findings, and additional imaging will be scheduled. BI-RADS CATEGORY  0: Incomplete. Need additional imaging evaluation and/or prior mammograms for comparison. Electronically Signed   By: Lillia Mountain M.D.   On: 05/15/2021 08:26    Independent interpretation of notes and tests performed by another provider:   None  Procedures performed:   None  Pertinent History, Exam, Impression, and Recommendations:   Depression Depression screen [facility] 2/9 05/20/2021 04/16/2021 04/10/2021  03/28/2021  Decreased Interest 0 0 1 0  Down, Depressed, Hopeless _0 PHQ - 2 Score _1 Altered sleeping 1 0 0 2  Tired, decreased energy 1 0 1 1  Change in appetite 1 0 1 0  Feeling bad or failure about yourself  0 0 1 0  Trouble concentrating 0 0 0 0   Moving slowly or fidgety/restless _2 Suicidal thoughts 0 0 0 0  PHQ-9 Score _3 Difficult doing work/chores Extremely dIfficult Somewhat difficult Extremely dIfficult Extremely dIfficult   GAD 7 : Generalized Anxiety Score 05/20/2021 04/16/2021 04/10/2021 03/28/2021  Nervous, Anxious, on Edge 1 0 0 1  Control/stop worrying 1 0 1 1  Worry too much - different things 2 0 0 1  Trouble relaxing 1 0 0 2  Restless 0 0 0 1  Easily annoyed or irritable 0 0 0 1  Afraid - awful might happen 0 0 0 1  Total GAD 7 Score 5 0 1 8  Anxiety Difficulty Extremely difficult Not difficult at all Extremely difficult Extremely difficult   Reassuring and low PHQ and GAD scores, TSH and additional labs ordered.  We will will be initiating duloxetine primarily for chronic musculoskeletal pain, though this may provide adjunct mood control.    BMI 50.0-59.9, adult (HCC) Current BMI Body mass index is 51.92 kg/m.  Given comorbid significant bilateral knee pain with underlying osteoarthritis, plan for risk stratification labs, initiation of Rybelsus starter sample kit x1 month, continued focus on diet and exercise which is demonstrated steady weight loss, and a referral to general surgery/bariatrics for further evaluation and management options.  Primary osteoarthritis of right knee Chronic issue with ongoing symptomatology, at the last visit she did tolerate corticosteroid injections which provided significant and near total pain control x 3-4 weeks for the right knee, left knee is still noting mild improvement from injections.  That being said, pain has begun to return, right greater than left.  This is in the setting of BMI 51.92, underlying osteoarthritis.  I have reviewed additional management strategies and she is amenable to initiation of duloxetine with plan to titrate in 2 weeks to 60 mg, scheduled right knee genicular nerve block.  If the block is beneficial, a referral to pain and spine for nerve  ablation to be coordinated.  We will check on the status of viscosupplementation and patient will continue physical therapy, home exercises, diclofenac, start as needed acetaminophen, and weight loss strategies over the interim.  Flu vaccine need Annual influenza vaccine administered today  Primary osteoarthritis of left knee See additional assessment(s) for plan details.   Orders & Medications Meds ordered this encounter  Medications   DULoxetine (CYMBALTA) 30 MG capsule    Sig: Take 1 capsule (30 mg total) by mouth every evening for 14 days, THEN 2 capsules (60 mg total) every evening for 16 days.    Dispense:  46 capsule    Refill:  0   Orders Placed This Encounter  Procedures   TSH Rfx on Abnormal to Free T4   Lipid panel   CBC   Comprehensive metabolic panel   Amb Referral to Bariatric Surgery     Return in about 4 weeks (around 06/17/2021) for 40 minute procedure visit, follow-up labs.     [name], MD   Primary Care Sports Medicine [place]

## 2021-05-20 NOTE — Assessment & Plan Note (Signed)
See additional assessment(s) for plan details. 

## 2021-05-22 ENCOUNTER — Encounter: Payer: PRIVATE HEALTH INSURANCE | Admitting: Physical Therapy

## 2021-05-23 ENCOUNTER — Other Ambulatory Visit: Payer: Self-pay

## 2021-05-23 ENCOUNTER — Ambulatory Visit
Admission: RE | Admit: 2021-05-23 | Discharge: 2021-05-23 | Disposition: A | Discharge status: Home / Self Care | Payer: PRIVATE HEALTH INSURANCE | Source: Ambulatory Visit | Attending: Family Medicine | Admitting: Family Medicine

## 2021-05-23 DIAGNOSIS — R928 Other abnormal and inconclusive findings on diagnostic imaging of breast: Secondary | ICD-10-CM | POA: Diagnosis present

## 2021-05-23 DIAGNOSIS — N6310 Unspecified lump in the right breast, unspecified quadrant: Secondary | ICD-10-CM | POA: Insufficient documentation

## 2021-05-23 DIAGNOSIS — N6320 Unspecified lump in the left breast, unspecified quadrant: Secondary | ICD-10-CM | POA: Insufficient documentation

## 2021-05-23 LAB — COMPREHENSIVE METABOLIC PANEL
ALT: 25 IU/L (ref 0–32)
AST: 23 IU/L (ref 0–40)
Albumin/Globulin Ratio: 2 (ref 1.2–2.2)
Albumin: 4.1 g/dL (ref 3.8–4.8)
Alkaline Phosphatase: 90 IU/L (ref 44–121)
BUN/Creatinine Ratio: 21 (ref 9–23)
BUN: 15 mg/dL (ref 6–24)
Bilirubin Total: 0.4 mg/dL (ref 0.0–1.2)
CO2: 22 mmol/L (ref 20–29)
Calcium: 9.1 mg/dL (ref 8.7–10.2)
Chloride: 101 mmol/L (ref 96–106)
Creatinine, Ser: 0.72 mg/dL (ref 0.57–1.00)
Globulin, Total: 2.1 g/dL (ref 1.5–4.5)
Glucose: 83 mg/dL (ref 70–99)
Potassium: 4.9 mmol/L (ref 3.5–5.2)
Sodium: 138 mmol/L (ref 134–144)
Total Protein: 6.2 g/dL (ref 6.0–8.5)
eGFR: 104 mL/min/{1.73_m2} (ref >59)

## 2021-05-23 LAB — LIPID PANEL
Chol/HDL Ratio: 2.6 ratio (ref 0.0–4.4)
Cholesterol, Total: 169 mg/dL (ref 100–199)
HDL: 66 mg/dL (ref >39)
LDL Chol Calc (NIH): 89 mg/dL (ref 0–99)
Triglycerides: 72 mg/dL (ref 0–149)
VLDL Cholesterol Cal: 14 mg/dL (ref 5–40)

## 2021-05-23 LAB — TSH RFX ON ABNORMAL TO FREE T4: TSH: 1.73 u[IU]/mL (ref 0.450–4.500)

## 2021-05-23 LAB — CBC
Hematocrit: 38.4 % (ref 34.0–46.6)
Hemoglobin: 13 g/dL (ref 11.1–15.9)
MCH: 31.3 pg (ref 26.6–33.0)
MCHC: 33.9 g/dL (ref 31.5–35.7)
MCV: 92 fL (ref 79–97)
Platelets: 262 10*3/uL (ref 150–450)
RBC: 4.16 x10E6/uL (ref 3.77–5.28)
RDW: 13 % (ref 11.7–15.4)
WBC: 5.5 10*3/uL (ref 3.4–10.8)

## 2021-05-28 ENCOUNTER — Other Ambulatory Visit: Payer: Self-pay

## 2021-05-28 ENCOUNTER — Encounter: Payer: Self-pay | Admitting: Physical Therapy

## 2021-05-28 ENCOUNTER — Ambulatory Visit: Payer: PRIVATE HEALTH INSURANCE

## 2021-05-28 DIAGNOSIS — M25561 Pain in right knee: Secondary | ICD-10-CM

## 2021-05-28 DIAGNOSIS — M170 Bilateral primary osteoarthritis of knee: Secondary | ICD-10-CM

## 2021-05-28 DIAGNOSIS — G8929 Other chronic pain: Secondary | ICD-10-CM

## 2021-05-28 DIAGNOSIS — R29898 Other symptoms and signs involving the musculoskeletal system: Secondary | ICD-10-CM

## 2021-05-28 NOTE — Therapy (Signed)
[facility] [address] [place], [place], [address] Phone: [phone]   Fax:  [phone]  Physical Therapy Treatment  Patient Details  Name: Tammie Grimes MRN: [id number] Date of Birth: 1975-08-28 Referring Provider (PT): Rosette Reveal, MD   Encounter Date: 05/28/2021   PT End of Session - 05/28/21 1306     Visit Number 6    Number of Visits 16    Date for PT Re-Evaluation 06/19/21    Authorization Type First 6 visits authorization    Authorization Time Period First 6 visits authorization    Authorization - Visit Number 6    Authorization - Number of Visits 10    Progress Note Due on Visit 10    PT Start Time 1301    PT Stop Time 1342    PT Time Calculation (min) 41 min    Activity Tolerance Patient tolerated treatment well;No increased pain    Behavior During Therapy WFL for tasks assessed/performed             Past Medical History:  Diagnosis Date   Allergy     History reviewed. No pertinent surgical history.  There were no vitals filed for this visit.   Subjective Assessment - 05/28/21 1303     Subjective Pt reports minor pain 1-2/10 NPS in B knees. 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    Pertinent History PMH with h/o depression, BMI: 50-59.9, Injection 04/16/2021, and plantar fasciitis. Pt would like to return to [employer] for independent exercise soon.    Limitations Walking;Lifting;Standing    How long can you sit comfortably? WNL    How long can you stand comfortably? 10 mins    How long can you walk comfortably? 1 hour    Diagnostic tests x-rays dated 03/31/2021 reveal mild-moderate tricompartmental degenerative changes    Patient Stated Goals Need to be able to squat and lift for work. Able to climb stairs. Longer bouts of standing on feet.    Currently in Pain? Yes    Pain Score 2     Pain Location Knee    Pain Orientation Right;Left    Pain Descriptors  / Indicators Aching;Sharp    Pain Type Chronic pain    Pain Onset More than a month ago    Pain Frequency Intermittent             There.ex:   Nu Step L2 for 8 min. For joint nutrition and LE warm up.   4# AW's: BUE support    Forward marches/ lateral marches/ hamstring curls x4 laps in // bars each   Squats with BUE support on // bars and use of mirror for visual feedback. Good form/technique   Use of chair as tactile cue of buttocks with airex pad in seat: x10   Use of chair with no airex pad: 2x10 with VC's for improving eccentric control      Alternating walking lunges with BUE's on // bars: x2 laps with cuing for upright posture and reduced ant trunk lean. Improvement in R knee discomfort after cuing for form.   Standing hip abduction and extension:  1x12 with no resistance. Progressed to 2x8 with GTB at ankles. Use of mirror as visual cue for upright posture.   Resisted backwards walking for knee/hip extension: x6 with x1 BTB  Hook lying glut bridges: 1x10     PT Education - 05/28/21 1305     Education Details  form/technique with exercise.    Person(s) Educated Patient    Methods Explanation;Demonstration;Tactile cues;Verbal cues    Comprehension Verbalized understanding;Returned demonstration;Verbal cues required;Tactile cues required              PT Short Term Goals - 04/24/21 1508       PT SHORT TERM GOAL #1   Title Pt reports appropirate return to public gym at least 2x a week without increase in abnormal pain or soreness in bilat LE.    Baseline Pt is currently not activity engage in gym based exercises 2/2 to fear other additional bilat knee pain flare up.    Time 4    Period Weeks    Status New    Target Date 05/22/21               PT Long Term Goals - 04/24/21 1519       PT LONG TERM GOAL #1   Title Pt will obtained a FOTO score of 70 to measure self-reported improvement during functional ADLs    Baseline 52    Time 8    Period Weeks     Status New    Target Date 06/19/21      PT LONG TERM GOAL #2   Title Pt will increase strength of by at least 1/2 MMT grade in order to demonstrate improvement in strength and function.    Baseline MMT(R/L): 4/4 Hip flexion. 4/4 Hip ER. 5/5 Hip IR. 5/5 Hip abduction. 5/5 Hip adduction. 4/4 Knee extension.  4/4 Knee flexion * pain with L knee flexion*. 5/5 Ankle dorsiflexion.    Time 8    Period Weeks    Status New    Target Date 06/19/21      PT LONG TERM GOAL #3   Title Pt will be independent with HEP in order to decrease worse bilat knee pain by atleast 4/10 NPS in order to improve pain-free function at home and work.    Baseline Worse pain 10/10    Time 8    Period Weeks    Status New    Target Date 06/19/21      PT LONG TERM GOAL #4   Title Pt will be able to perfrom a box lift of atleast 30# with proper form and pain less than 2/10 10 times.    Baseline Pt was able to lift 17 # box with proper form with verbal cueing.    Time 8    Period Weeks    Status New    Target Date 06/19/21      PT LONG TERM GOAL #5   Title Pt will be able to complete at least 12 stairs without knee pain and proper form to allow stair amb for work related tasks    Baseline 4 steps x3 without rails. Slight increase in pain in the R knee compared to the L. Slight toe out of L foot compared to R.    Time 8    Period Weeks    Status New    Target Date 06/19/21                   Plan - 05/28/21 1306     Clinical Impression Statement Due to pt reports, heavy focus on squatting due to pt goals. Able to progress to squatting at standard chair height with excellent forn/technique with exercise. Educated on progression of hip and glut strengthening given GTB due to good understanding and tolerance for loading with  no pain. Pt displays minor knee pain during treatment that improve with modifcation of form/technique. Will continue to benefit from skilled PT services to progress R knee ROM and  strengthening.    Personal Factors and Comorbidities Comorbidity 3+;Past/Current Experience;Profession    Examination-Activity Limitations Stairs;Stand;Transfers;Locomotion Level;Squat;Lift    Examination-Participation Restrictions Occupation    Stability/Clinical Decision Making Evolving/Moderate complexity    Clinical Decision Making Moderate    Rehab Potential Good    PT Frequency 2x / week    PT Duration 8 weeks    PT Treatment/Interventions Aquatic Therapy;Biofeedback;Electrical Stimulation;Traction;Moist Heat;Ultrasound;Gait training;Stair training;Functional mobility training;Therapeutic activities;Therapeutic exercise;Balance training;Neuromuscular re-education;Passive range of motion;Dry needling;Energy conservation;Joint Manipulations;Spinal Manipulations    PT Home Exercise Plan [email or web address]    Consulted and Agree with Plan of Care Patient             Patient will benefit from skilled therapeutic intervention in order to improve the following deficits and impairments:  Abnormal gait, Decreased balance, Decreased activity tolerance, Decreased endurance, Decreased coordination, Decreased mobility, Decreased range of motion, Hypomobility, Decreased strength, Impaired flexibility, Improper body mechanics, Pain, Obesity, Postural dysfunction  Visit Diagnosis: Chronic pain of right knee  Impaired strength of lower extremity  Chronic pain of left knee  Bilateral primary osteoarthritis of knee     Problem List Patient Active Problem List   Diagnosis Date Noted   Flu vaccine need 05/20/2021   Primary osteoarthritis of right knee 04/10/2021   Primary osteoarthritis of left knee 04/10/2021   BMI 50.0-59.9, adult (HCC) 04/10/2021   Plantar fasciitis 10/23/2020   Allergic rhinitis 05/24/2019   Allergy 05/24/2019   Depo-Provera contraceptive status 05/24/2019   Depression 05/24/2019    Delphia Grates. Fairly IV, PT, DPT Physical Therapist- [facility]  05/28/2021, 2:01 PM  [facility] [facility] [address] [place], [place], [address] Phone: [phone]   Fax:  [phone]  Name: Tammie Grimes MRN: [id number] Date of Birth: [birth year]

## 2021-06-03 ENCOUNTER — Encounter: Payer: PRIVATE HEALTH INSURANCE | Admitting: Physical Therapy

## 2021-06-05 ENCOUNTER — Other Ambulatory Visit: Payer: Self-pay

## 2021-06-05 ENCOUNTER — Ambulatory Visit: Payer: PRIVATE HEALTH INSURANCE | Admitting: Physical Therapy

## 2021-06-05 ENCOUNTER — Encounter: Payer: Self-pay | Admitting: Physical Therapy

## 2021-06-05 DIAGNOSIS — M25562 Pain in left knee: Secondary | ICD-10-CM

## 2021-06-05 DIAGNOSIS — R29898 Other symptoms and signs involving the musculoskeletal system: Secondary | ICD-10-CM

## 2021-06-05 DIAGNOSIS — M170 Bilateral primary osteoarthritis of knee: Secondary | ICD-10-CM

## 2021-06-05 DIAGNOSIS — M25561 Pain in right knee: Secondary | ICD-10-CM | POA: Diagnosis not present

## 2021-06-05 DIAGNOSIS — G8929 Other chronic pain: Secondary | ICD-10-CM

## 2021-06-05 NOTE — Therapy (Signed)
[facility] [address] [place], [place], [address] Phone: [phone]   Fax:  [phone]  Physical Therapy Treatment  Patient Details  Name: Tammie Grimes MRN: [id number] Date of Birth: January 02, 1976 Referring Provider (PT): Rosette Reveal, MD   Encounter Date: 06/05/2021   PT End of Session - 06/05/21 1310     Visit Number 7    Number of Visits 16    Date for PT Re-Evaluation 06/19/21    Authorization Type First 6 visits authorization    Authorization Time Period First 6 visits authorization    Authorization - Visit Number 7    Authorization - Number of Visits 10    Progress Note Due on Visit 10    PT Start Time 1304    PT Stop Time 1350    PT Time Calculation (min) 46 min    Activity Tolerance Patient tolerated treatment well    Behavior During Therapy [facility] for tasks assessed/performed             Past Medical History:  Diagnosis Date   Allergy     History reviewed. No pertinent surgical history.  There were no vitals filed for this visit.   Subjective Assessment - 06/05/21 1307     Subjective Pt reports minor pain 1-2/10 NPS in R knee currently.  Pt. c/o 6-7/10 R knee pain with increase clicking/ pain (meniscal discomfort).  Pt. has returned to [employer] this morning and states she did well (core/ UE and rode bike).    Pertinent History PMH with h/o depression, BMI: 50-59.9, Injection 04/16/2021, and plantar fasciitis. Pt would like to return to [employer] for independent exercise soon.    Limitations Walking;Lifting;Standing    How long can you sit comfortably? WNL    How long can you stand comfortably? 10 mins    How long can you walk comfortably? 1 hour    Diagnostic tests x-rays dated 03/31/2021 reveal mild-moderate tricompartmental degenerative changes    Patient Stated Goals Need to be able to squat and lift for work. Able to climb stairs. Longer bouts of standing on feet.    Currently in  Pain? Yes    Pain Score 2     Pain Location Knee    Pain Orientation Right    Pain Descriptors / Indicators Aching;Sharp    Pain Type Chronic pain    Pain Onset More than a month ago             There.ex:   No Nustep today secondary to pt. Performed at gym this morning.     Standing 5# ankle wt.: Forward marches/ hip extension/ hip abduction/ hamstring curls x 20 in //-bars.   Supine R quad sets with towel behind knee (10x 10 sec. Holds).  Pelvic tilts/ bridging.  Resisted gait 2BTB 5x all 4-planes of movement.    Supine R lower leg LAD 5x.  Supine R proximal tibia AP grade II-III mobs. (As tolerated)- increase L knee joint line tenderness.                    PT Short Term Goals - 06/05/21 1335       PT SHORT TERM GOAL #1   Title Pt reports appropirate return to public gym at least 2x a week without increase in abnormal pain or soreness in bilat LE.    Baseline Pt is currently not activity engage in gym based exercises 2/2 to fear other additional bilat  knee pain flare up.    Time 4    Period Weeks    Status Achieved    Target Date 06/05/21               PT Long Term Goals - 04/24/21 1519       PT LONG TERM GOAL #1   Title Pt will obtained a FOTO score of 70 to measure self-reported improvement during functional ADLs    Baseline 52    Time 8    Period Weeks    Status New    Target Date 06/19/21      PT LONG TERM GOAL #2   Title Pt will increase strength of by at least 1/2 MMT grade in order to demonstrate improvement in strength and function.    Baseline MMT(R/L): 4/4 Hip flexion. 4/4 Hip ER. 5/5 Hip IR. 5/5 Hip abduction. 5/5 Hip adduction. 4/4 Knee extension.  4/4 Knee flexion * pain with L knee flexion*. 5/5 Ankle dorsiflexion.    Time 8    Period Weeks    Status New    Target Date 06/19/21      PT LONG TERM GOAL #3   Title Pt will be independent with HEP in order to decrease worse bilat knee pain by atleast 4/10 NPS in order to improve pain-free  function at home and work.    Baseline Worse pain 10/10    Time 8    Period Weeks    Status New    Target Date 06/19/21      PT LONG TERM GOAL #4   Title Pt will be able to perfrom a box lift of atleast 30# with proper form and pain less than 2/10 10 times.    Baseline Pt was able to lift 17 # box with proper form with verbal cueing.    Time 8    Period Weeks    Status New    Target Date 06/19/21      PT LONG TERM GOAL #5   Title Pt will be able to complete at least 12 stairs without knee pain and proper form to allow stair amb for work related tasks    Baseline 4 steps x3 without rails. Slight increase in pain in the R knee compared to the L. Slight toe out of L foot compared to R.    Time 8    Period Weeks    Status New    Target Date 06/19/21                   Plan - 06/05/21 1716     Clinical Impression Statement Pt. presents with R knee medial meniscus inflammation/ clicking noted with wt. bearing.  (+) R knee joint line tenderness noted as compared to L knee. Frequent R knee "popping/ clicking" with standing marching/ hip abduction and walking/pivoting in clinic.  Pt. did well with addition of 5# ankle wts. to increase hip strength/quad strengthening.  Pain during standing L hip abduction due to wt. bearing on R.  Pt. educated on symptoms and benefits of quad strengthening/ gym based ex. Pt. will benefit from aquatic ex. and planning to go to pool    Personal Factors and Comorbidities Comorbidity 3+;Past/Current Experience;Profession    Examination-Activity Limitations Stairs;Stand;Transfers;Locomotion Level;Squat;Lift    Examination-Participation Restrictions Occupation    Stability/Clinical Decision Making Evolving/Moderate complexity    Clinical Decision Making Moderate    Rehab Potential Good    PT Frequency 2x / week  PT Duration 8 weeks    PT Treatment/Interventions Aquatic Therapy;Biofeedback;Electrical Stimulation;Traction;Moist Heat;Ultrasound;Gait  training;Stair training;Functional mobility training;Therapeutic activities;Therapeutic exercise;Balance training;Neuromuscular re-education;Passive range of motion;Dry needling;Energy conservation;Joint Manipulations;Spinal Manipulations    PT Next Visit Plan R LE strengthening.  Discuss gym based/ aquatic ex.    PT Home Exercise Plan [id number]    Consulted and Agree with Plan of Care Patient             Patient will benefit from skilled therapeutic intervention in order to improve the following deficits and impairments:  Abnormal gait, Decreased balance, Decreased activity tolerance, Decreased endurance, Decreased coordination, Decreased mobility, Decreased range of motion, Hypomobility, Decreased strength, Impaired flexibility, Improper body mechanics, Pain, Obesity, Postural dysfunction  Visit Diagnosis: Chronic pain of right knee  Impaired strength of lower extremity  Chronic pain of left knee  Bilateral primary osteoarthritis of knee     Problem List Patient Active Problem List   Diagnosis Date Noted   Flu vaccine need 05/20/2021   Primary osteoarthritis of right knee 04/10/2021   Primary osteoarthritis of left knee 04/10/2021   BMI 50.0-59.9, adult ([place]) 04/10/2021   Plantar fasciitis 10/23/2020   Allergic rhinitis 05/24/2019   Allergy 05/24/2019   Depo-Provera contraceptive status 05/24/2019   Depression 05/24/2019   Pura Spice, PT, DPT # [phone] 06/05/2021, 5:21 PM  [facility] [address] [place], [place], [address] Phone: [phone]   Fax:  [phone]  Name: Tammie Grimes MRN: [id number] Date of Birth: 01/23/1976

## 2021-06-11 ENCOUNTER — Encounter: Payer: PRIVATE HEALTH INSURANCE | Admitting: Physical Therapy

## 2021-06-13 ENCOUNTER — Other Ambulatory Visit: Payer: Self-pay

## 2021-06-13 ENCOUNTER — Ambulatory Visit: Payer: PRIVATE HEALTH INSURANCE | Attending: Family Medicine | Admitting: Physical Therapy

## 2021-06-13 ENCOUNTER — Encounter: Payer: Self-pay | Admitting: Physical Therapy

## 2021-06-13 DIAGNOSIS — G8929 Other chronic pain: Secondary | ICD-10-CM | POA: Diagnosis present

## 2021-06-13 DIAGNOSIS — M25562 Pain in left knee: Secondary | ICD-10-CM | POA: Diagnosis present

## 2021-06-13 DIAGNOSIS — M170 Bilateral primary osteoarthritis of knee: Secondary | ICD-10-CM | POA: Diagnosis present

## 2021-06-13 DIAGNOSIS — M25561 Pain in right knee: Secondary | ICD-10-CM | POA: Insufficient documentation

## 2021-06-13 DIAGNOSIS — R29898 Other symptoms and signs involving the musculoskeletal system: Secondary | ICD-10-CM | POA: Diagnosis present

## 2021-06-13 NOTE — Therapy (Signed)
[facility] [address] [place], [place], [address] Phone: [phone]   Fax:  [phone]  Physical Therapy Treatment  Patient Details  Name: Tammie Grimes MRN: [id number] Date of Birth: [birth year] Referring Provider (PT): Rosette Reveal, MD   Encounter Date: 06/13/2021    PT End of Session - 06/13/21 1307     Visit Number 8    Number of Visits 16    Date for PT Re-Evaluation 06/19/21    Authorization Type First 6 visits authorization    Authorization Time Period First 6 visits authorization    Authorization - Visit Number 8    Authorization - Number of Visits 10    Progress Note Due on Visit 10    PT Start Time 1301    Activity Tolerance Patient tolerated treatment well    Behavior During Therapy [facility] for tasks assessed/performed             Past Medical History:  Diagnosis Date   Allergy     History reviewed. No pertinent surgical history.  There were no vitals filed for this visit.   Subjective Assessment - 06/13/21 1302     Subjective Pt. states she did an aqua zumba class today and really enjoyed it.  Pt. reports minimal to no knee pain prior to tx. session.    Pertinent History PMH with h/o depression, BMI: 50-59.9, Injection 04/16/2021, and plantar fasciitis. Pt would like to return to [employer] for independent exercise soon.    Limitations Walking;Lifting;Standing    How long can you sit comfortably? WNL    How long can you stand comfortably? 10 mins    How long can you walk comfortably? 1 hour    Diagnostic tests x-rays dated 03/31/2021 reveal mild-moderate tricompartmental degenerative changes    Patient Stated Goals Need to be able to squat and lift for work. Able to climb stairs. Longer bouts of standing on feet.    Currently in Pain? No/denies    Pain Onset More than a month ago                There.ex:    Nustep L4 B LE only for 10 minutes.  Discussed gym based ex.     Walking  in gym with consistent step pattern/ heel strike.    Standing 5# ankle wt.: Forward marches/ hip extension/ lateral walking/ hamstring curls x 20 in //-bars.   Supine R quad sets with towel behind knee (10x 10 sec. Holds).  Bolster bridging 10x with holds.   Supine R lower leg LAD 5x.  Supine R proximal tibia AP grade II-III mobs. (As tolerated)- increase L knee joint line tenderness.                 2nd step lunges/ hamstring stretches 3x.       PT Short Term Goals - 06/05/21 1335       PT SHORT TERM GOAL #1   Title Pt reports appropirate return to public gym at least 2x a week without increase in abnormal pain or soreness in bilat LE.    Baseline Pt is currently not activity engage in gym based exercises 2/2 to fear other additional bilat knee pain flare up.    Time 4    Period Weeks    Status Achieved    Target Date 06/05/21               PT Long Term Goals - 04/24/21 1519  PT LONG TERM GOAL #1   Title Pt will obtained a FOTO score of 70 to measure self-reported improvement during functional ADLs    Baseline 52    Time 8    Period Weeks    Status New    Target Date 06/19/21      PT LONG TERM GOAL #2   Title Pt will increase strength of by at least 1/2 MMT grade in order to demonstrate improvement in strength and function.    Baseline MMT(R/L): 4/4 Hip flexion. 4/4 Hip ER. 5/5 Hip IR. 5/5 Hip abduction. 5/5 Hip adduction. 4/4 Knee extension.  4/4 Knee flexion * pain with L knee flexion*. 5/5 Ankle dorsiflexion.    Time 8    Period Weeks    Status New    Target Date 06/19/21      PT LONG TERM GOAL #3   Title Pt will be independent with HEP in order to decrease worse bilat knee pain by atleast 4/10 NPS in order to improve pain-free function at home and work.    Baseline Worse pain 10/10    Time 8    Period Weeks    Status New    Target Date 06/19/21      PT LONG TERM GOAL #4   Title Pt will be able to perfrom a box lift of atleast 30# with proper form  and pain less than 2/10 10 times.    Baseline Pt was able to lift 17 # box with proper form with verbal cueing.    Time 8    Period Weeks    Status New    Target Date 06/19/21      PT LONG TERM GOAL #5   Title Pt will be able to complete at least 12 stairs without knee pain and proper form to allow stair amb for work related tasks    Baseline 4 steps x3 without rails. Slight increase in pain in the R knee compared to the L. Slight toe out of L foot compared to R.    Time 8    Period Weeks    Status New    Target Date 06/19/21              Pt. showing good progress with R LE strengthening in a pain tolerate range.  Tx. focus on hip/LE muscle strengthening.  Pt. has started an aquatic ex. class to promote increase activity tolerance with less overall LE pain.  Pt. will continue with land based ex. as tolerated and PT supports all aquatic based ther.ex.       Patient will benefit from skilled therapeutic intervention in order to improve the following deficits and impairments:  Abnormal gait, Decreased balance, Decreased activity tolerance, Decreased endurance, Decreased coordination, Decreased mobility, Decreased range of motion, Hypomobility, Decreased strength, Impaired flexibility, Improper body mechanics, Pain, Obesity, Postural dysfunction  Visit Diagnosis: Chronic pain of right knee  Impaired strength of lower extremity  Chronic pain of left knee  Bilateral primary osteoarthritis of knee     Problem List Patient Active Problem List   Diagnosis Date Noted   Flu vaccine need 05/20/2021   Primary osteoarthritis of right knee 04/10/2021   Primary osteoarthritis of left knee 04/10/2021   BMI 50.0-59.9, adult ([place]) 04/10/2021   Plantar fasciitis 10/23/2020   Allergic rhinitis 05/24/2019   Allergy 05/24/2019   Depo-Provera contraceptive status 05/24/2019   Depression 05/24/2019   Pura Spice, PT, DPT # [phone] 06/18/2021, 1:33 PM  [place]  [facility] [address] [facility] [address] [place], [place], [address] Phone: [phone]   Fax:  [phone]  Name: Tammie Grimes MRN: [id number] Date of Birth: September 22, 1975

## 2021-06-17 ENCOUNTER — Ambulatory Visit: Payer: PRIVATE HEALTH INSURANCE | Admitting: Family Medicine

## 2021-06-17 ENCOUNTER — Encounter: Payer: PRIVATE HEALTH INSURANCE | Admitting: Physical Therapy

## 2021-06-19 ENCOUNTER — Encounter: Payer: PRIVATE HEALTH INSURANCE | Admitting: Physical Therapy

## 2021-06-25 ENCOUNTER — Other Ambulatory Visit: Payer: Self-pay

## 2021-06-25 ENCOUNTER — Ambulatory Visit: Payer: PRIVATE HEALTH INSURANCE | Admitting: Physical Therapy

## 2021-06-27 ENCOUNTER — Ambulatory Visit (INDEPENDENT_AMBULATORY_CARE_PROVIDER_SITE_OTHER): Payer: PRIVATE HEALTH INSURANCE | Admitting: Family Medicine

## 2021-06-27 ENCOUNTER — Other Ambulatory Visit: Payer: Self-pay

## 2021-06-27 ENCOUNTER — Other Ambulatory Visit: Payer: Self-pay | Admitting: Family Medicine

## 2021-06-27 ENCOUNTER — Inpatient Hospital Stay (INDEPENDENT_AMBULATORY_CARE_PROVIDER_SITE_OTHER): Payer: PRIVATE HEALTH INSURANCE | Admitting: Radiology

## 2021-06-27 ENCOUNTER — Encounter: Payer: Self-pay | Admitting: Family Medicine

## 2021-06-27 VITALS — BP 128/94 | HR 80 | Ht 65.0 in | Wt 294.0 lb

## 2021-06-27 DIAGNOSIS — Z6841 Body Mass Index (BMI) 40.0 and over, adult: Secondary | ICD-10-CM

## 2021-06-27 DIAGNOSIS — M1712 Unilateral primary osteoarthritis, left knee: Secondary | ICD-10-CM

## 2021-06-27 DIAGNOSIS — M1711 Unilateral primary osteoarthritis, right knee: Secondary | ICD-10-CM

## 2021-06-27 DIAGNOSIS — E66813 Obesity, class 3: Secondary | ICD-10-CM

## 2021-06-27 MED ORDER — SEMAGLUTIDE 7 MG PO TABS
1.0000 | ORAL_TABLET | Freq: Every day | ORAL | 0 refills | Status: DC
Start: 2021-06-27 — End: 2021-06-28

## 2021-06-27 NOTE — Telephone Encounter (Signed)
Requested medication (s) are due for refill today:   Yes new rx written today by Dr. Ashley Royalty  Requested medication (s) are on the active medication list:   Yes  Future visit scheduled:   Yes   Last ordered: Today  Returned because pharmacy needs a Prior [place].   Requested Prescriptions  Pending Prescriptions Disp Refills   RYBELSUS 7 MG TABS [Pharmacy Med Name: RYBELSUS 7MG         TAB] 30 tablet 0    Sig: TAKE 1 TABLET BY MOUTH ONCE DAILY     Off-Protocol Failed - 06/27/2021 10:55 AM      Failed - Medication not assigned to a protocol, review manually.      Passed - Valid encounter within last 12 months    Recent Outpatient Visits           Today Primary osteoarthritis of right knee   [facility] 06/29/2021, MD   1 month ago Primary osteoarthritis of right knee   [facility], [facility]   2 months ago Primary osteoarthritis of left knee   [facility], [facility]   2 months ago Primary osteoarthritis of right knee   [facility], [facility]   3 months ago Arthralgia of knee, right   [facility], MD       Future Appointments             In 4 weeks Jerrol Banana Ashley Royalty, MD [facility], [facility]

## 2021-06-27 NOTE — Patient Instructions (Addendum)
You have just been given a gel lubricant injection to reduce knee pain. You can resume normal activities but avoid anything strenuous that puts excess strain on the joint for 48 hours. Avoid activities such as jogging, soccer, tennis, heavy lifting, or standing on your feet for a long time. If you do have pain, simply rest the joint and use ice. If you can tolerate over the counter medications, you can try Tylenol, Aleve, or Advil for added relief per package instructions. - The gel can be repeated in 6 months, contact our office to schedule or for any questions - Continue duloxetine as discussed - We will attempt Rx for Rybelsus - Transition to as-needed dosing of diclofenac (every 12 hours for pain) - Return in 1 month

## 2021-06-27 NOTE — Assessment & Plan Note (Addendum)
Chronic issue with ongoing symptomatology. Patient has demonstrated excellent interval response to Rybelsus 3 mg with 18 pounds of documented weight loss fo, no adverse effects reported.  Her recent risk stratification labs are reassuring though her severe bilateral tricompartmental knee osteoarthritis is directly related to her elevated BMI.  I have discussed methods to continue her weight loss with a titration of Rybelsus to 7 mg, continued physical therapy, water aerobics, dietary and other healthy lifestyle changes.  We will follow-up on this issue in 1 month's time.

## 2021-06-27 NOTE — Telephone Encounter (Signed)
°  Notes to clinic:  signed today, had visit today, med not delegated, please assess.   Requested Prescriptions  Pending Prescriptions Disp Refills   RYBELSUS 7 MG TABS [Pharmacy Med Name: RYBELSUS 7MG         TAB] 30 tablet 0    Sig: TAKE 1 TABLET BY MOUTH ONCE DAILY     Off-Protocol Failed - 06/27/2021  3:43 PM      Failed - Medication not assigned to a protocol, review manually.      Passed - Valid encounter within last 12 months    Recent Outpatient Visits           Today Primary osteoarthritis of right knee   [facility] 06/29/2021, MD   1 month ago Primary osteoarthritis of right knee   [facility], [facility]   2 months ago Primary osteoarthritis of left knee   [facility], MD   2 months ago Primary osteoarthritis of right knee   [facility], MD   3 months ago Arthralgia of knee, right   [facility], MD       Future Appointments             In 4 weeks Jerrol Banana Ashley Royalty, MD [facility], [facility]

## 2021-06-27 NOTE — Assessment & Plan Note (Signed)
See additional assessment(s) for plan details. 

## 2021-06-27 NOTE — Progress Notes (Addendum)
Primary Care / Sports Medicine Office Visit  Patient Information:  Patient ID: BETHANNE MULE, female DOB: 10/10/1975 Age: 45 y.o. MRN: [id number]   Tammie Grimes is a pleasant 45 y.o. female presenting with the following:  Chief Complaint  Patient presents with   Primary osteoarthritis bilateral knees    Bilateral Monovisc injections today in office; following with PT, has one session left before need for renewal; has lost 16 pounds since last visit on Rybelsus 3 mg daily and doing water aerobics; taking diclofenac 75 mg bid and Voltaren topical gel tid, duloxetine 30 mg daily (could not tolerate 60 mg daily); 1/10 pain in office    Patient Active Problem List   Diagnosis Date Noted   Class 3 severe obesity with serious comorbidity and body mass index (BMI) of 45.0 to 49.9 in adult (H[facility]) 06/27/2021   Flu vaccine need 05/20/2021   Primary osteoarthritis of right knee 04/10/2021   Primary osteoarthritis of left knee 04/10/2021   BMI 50.0-59.9, adult (H[facility]) 04/10/2021   Plantar fasciitis 10/23/2020   Allergic rhinitis 05/24/2019   Allergy 05/24/2019   Depo-Provera contraceptive status 05/24/2019   Depression 05/24/2019    Vitals:   06/27/21 1008  BP: (!) 128/94  Pulse: 80  SpO2: 97%   Vitals:   06/27/21 1008  Weight: 294 lb (133.4 kg)  Height: 5\' 5"  (1.651 m)   Body mass index is 48.92 kg/m.  No results found.   Independent interpretation of notes and tests performed by another provider:   None  Procedures performed:   Procedure:  Injection of right anterolateral knee intra-articularly under ultrasound guidance. Ultrasound guidance utilized for visualization of the anterolateral joint space, no abnormalities noted, out of plane approach utilized, hypoechoic response from injectate noted Samsung HS60 device utilized with permanent recording / reporting. Consent obtained and verified. Skin prepped in a sterile fashion. Ethyl chloride spray for topical  local analgesia.  Completed without difficulty and tolerated well. Medication: Monovisc 88 mg (4 mL) Advised to contact for fevers/chills, erythema, induration, drainage, or persistent bleeding.  Procedure:  Injection of left anterolateral knee articularly under ultrasound guidance. Ultrasound guidance utilized for out of plane approach to the anterolateral joint line, interface between distal femur and proximal tibia noted, utilized for needle placement, hypoechoic response from injectate noted as medication administered Samsung HS60 device utilized with permanent recording / reporting. Consent obtained and verified. Skin prepped in a sterile fashion. Ethyl chloride spray for topical local analgesia.  Completed without difficulty and tolerated well. Medication: Monovisc 88 mg (4 mL) Advised to contact for fevers/chills, erythema, induration, drainage, or persistent bleeding.   Pertinent History, Exam, Impression, and Recommendations:   Primary osteoarthritis of right knee Patient here for bilateral knee viscosupplementation injections.  She tolerated these well, post care reviewed.  She has ongoing physical therapy and is performing water aerobics as well, has demonstrated 18 pounds of intentional weight loss, additionally was only able to tolerate duloxetine 30 mg due to excessive sleepiness when attempting to titrate to 60 mg, lastly dosing diclofenac twice daily scheduled.  Have advised her to continue with physical therapy, a new referral was generated today so that she can continue to attend the sessions, additionally she will trial an increase of duloxetine to 60 mg when she has some time off to see if her body can acclimate to this higher dose, additionally diclofenac will be transitioned to as needed dosing.   Primary osteoarthritis of left knee See additional assessment(s) for  plan details.  Class 3 severe obesity with serious comorbidity and body mass index (BMI) of 45.0 to 49.9  in adult [facility]) Chronic issue with ongoing symptomatology. Patient has demonstrated excellent interval response to Rybelsus 3 mg with 18 pounds of documented weight loss fo, no adverse effects reported.  Her recent risk stratification labs are reassuring though her severe bilateral tricompartmental knee osteoarthritis is directly related to her elevated BMI.  I have discussed methods to continue her weight loss with a titration of Rybelsus to 7 mg, continued physical therapy, water aerobics, dietary and other healthy lifestyle changes.  We will follow-up on this issue in 1 month's time.   Orders & Medications Meds ordered this encounter  Medications   Semaglutide 7 MG TABS    Sig: Take 1 tablet by mouth daily.    Dispense:  30 tablet    Refill:  0   Orders Placed This Encounter  Procedures   [place] LIMITED JOINT SPACE STRUCTURES LOW BILAT   Ambulatory referral to Physical Therapy     Return in about 4 weeks (around 07/25/2021).     Jerrol Banana, MD   Primary Care Sports Medicine [facility]

## 2021-06-27 NOTE — Assessment & Plan Note (Addendum)
Patient here for bilateral knee viscosupplementation injections.  She tolerated these well, post care reviewed.  She has ongoing physical therapy and is performing water aerobics as well, has demonstrated 18 pounds of intentional weight loss, additionally was only able to tolerate duloxetine 30 mg due to excessive sleepiness when attempting to titrate to 60 mg, lastly dosing diclofenac twice daily scheduled.  Have advised her to continue with physical therapy, a new referral was generated today so that she can continue to attend the sessions, additionally she will trial an increase of duloxetine to 60 mg when she has some time off to see if her body can acclimate to this higher dose, additionally diclofenac will be transitioned to as needed dosing.

## 2021-06-28 NOTE — Telephone Encounter (Signed)
PA submitted yesterday through CoverMyMeds, but was unable to be processed through [employer] when I submitted.  I have to now reach out to patient's insurance company to initiate a PA.

## 2021-06-28 NOTE — Telephone Encounter (Signed)
Please review. Pharmacy commented that this medication needs a PA.  KP

## 2021-07-03 ENCOUNTER — Ambulatory Visit: Payer: PRIVATE HEALTH INSURANCE | Admitting: Physical Therapy

## 2021-07-11 ENCOUNTER — Other Ambulatory Visit: Payer: Self-pay | Admitting: Family Medicine

## 2021-07-11 ENCOUNTER — Ambulatory Visit: Payer: PRIVATE HEALTH INSURANCE | Admitting: Physical Therapy

## 2021-07-11 ENCOUNTER — Encounter: Payer: Self-pay | Admitting: Family Medicine

## 2021-07-11 ENCOUNTER — Other Ambulatory Visit: Payer: Self-pay

## 2021-07-11 DIAGNOSIS — M1712 Unilateral primary osteoarthritis, left knee: Secondary | ICD-10-CM

## 2021-07-11 DIAGNOSIS — M1711 Unilateral primary osteoarthritis, right knee: Secondary | ICD-10-CM

## 2021-07-11 MED ORDER — DULOXETINE HCL 60 MG PO CPEP
60.0000 mg | ORAL_CAPSULE | Freq: Every day | ORAL | 2 refills | Status: DC
Start: 2021-07-11 — End: 2021-07-26

## 2021-07-11 MED ORDER — MONTELUKAST SODIUM 10 MG PO TABS: 10.0000 mg | ORAL_TABLET | Freq: Every day | ORAL | 1 refills | Status: DC

## 2021-07-11 MED ORDER — DICLOFENAC SODIUM 75 MG PO TBEC: 75.0000 mg | DELAYED_RELEASE_TABLET | Freq: Two times a day (BID) | ORAL | 2 refills | Status: DC | PRN

## 2021-07-11 NOTE — Telephone Encounter (Signed)
Please refill x 90 days all medications (duloxetine 60 mg to be daily, #30 with 2 refills)

## 2021-07-11 NOTE — Telephone Encounter (Signed)
For your information. Please advise for medication management.

## 2021-07-12 NOTE — Telephone Encounter (Signed)
Requested medication (s) are due for refill today: Unsure. Amount not specified on Diclofenac, Duloxetine HCL ? Completed course  Requested medication (s) are on the active medication list: yes  Last refill: Duloxetine 05/20/21  #46  0 refills    Diclofenac  06/27/21  Future visit scheduled yes 07/25/21  Notes to clinic: Please review  Requested Prescriptions  Pending Prescriptions Disp Refills   DULoxetine (CYMBALTA) 30 MG capsule [Pharmacy Med Name: DULoxetine HCl 30 MG Oral Capsule Delayed Release Particles] 46 capsule 0    Sig: TAKE 1 CAPSULE BY MOUTH EVERY EVENING FOR 14 DAYS AND THEN 2 CAPSULES EVERY EVENING FOR 16 DAYS     Psychiatry: Antidepressants - SNRI Failed - 07/11/2021  2:04 PM      Failed - Last BP in normal range    BP Readings from Last 1 Encounters:  06/27/21 (!) 128/94          Passed - Completed PHQ-2 or PHQ-9 in the last 360 days      Passed - Valid encounter within last 6 months    Recent Outpatient Visits           2 weeks ago Primary osteoarthritis of right knee   [facility] Jerrol Banana, MD   1 month ago Primary osteoarthritis of right knee   [facility] Jerrol Banana, MD   2 months ago Primary osteoarthritis of left knee   [facility] Jerrol Banana, MD   3 months ago Primary osteoarthritis of right knee   [facility] Jerrol Banana, MD   3 months ago Arthralgia of knee, right   [facility] Jerrol Banana, MD       Future Appointments             In 1 week Ashley Royalty, Ocie Bob, MD [facility]             diclofenac (VOLTAREN) 75 MG EC tablet [Pharmacy Med Name: Diclofenac Sodium 75 MG Oral Tablet Delayed Release] 30 tablet 0    Sig: Take 1 tablet by mouth twice daily     Analgesics:  NSAIDS Passed - 07/11/2021  2:04 PM      Passed - Cr in normal range and within 360 days    Creatinine, Ser  Date Value Ref Range Status  05/22/2021 0.72 0.57 - 1.00 mg/dL  Final          Passed - HGB in normal range and within 360 days    Hemoglobin  Date Value Ref Range Status  05/22/2021 13.0 11.1 - 15.9 g/dL Final          Passed - Patient is not pregnant      Passed - Valid encounter within last 12 months    Recent Outpatient Visits           2 weeks ago Primary osteoarthritis of right knee   [facility] Jerrol Banana, MD   1 month ago Primary osteoarthritis of right knee   [facility] Jerrol Banana, MD   2 months ago Primary osteoarthritis of left knee   [facility] Jerrol Banana, MD   3 months ago Primary osteoarthritis of right knee   [facility] Jerrol Banana, MD   3 months ago Arthralgia of knee, right   [facility] Jerrol Banana, MD       Future Appointments  In 1 week Ashley Royalty, Ocie Bob, MD [facility]

## 2021-07-23 ENCOUNTER — Other Ambulatory Visit: Payer: Self-pay

## 2021-07-23 ENCOUNTER — Encounter: Payer: Self-pay | Admitting: Physical Therapy

## 2021-07-23 ENCOUNTER — Ambulatory Visit: Payer: PRIVATE HEALTH INSURANCE | Attending: Family Medicine | Admitting: Physical Therapy

## 2021-07-23 DIAGNOSIS — R29898 Other symptoms and signs involving the musculoskeletal system: Secondary | ICD-10-CM | POA: Diagnosis present

## 2021-07-23 DIAGNOSIS — M25561 Pain in right knee: Secondary | ICD-10-CM | POA: Diagnosis present

## 2021-07-23 DIAGNOSIS — M1711 Unilateral primary osteoarthritis, right knee: Secondary | ICD-10-CM | POA: Diagnosis not present

## 2021-07-23 DIAGNOSIS — M25562 Pain in left knee: Secondary | ICD-10-CM | POA: Diagnosis present

## 2021-07-23 DIAGNOSIS — M1712 Unilateral primary osteoarthritis, left knee: Secondary | ICD-10-CM | POA: Insufficient documentation

## 2021-07-23 DIAGNOSIS — M170 Bilateral primary osteoarthritis of knee: Secondary | ICD-10-CM

## 2021-07-23 DIAGNOSIS — G8929 Other chronic pain: Secondary | ICD-10-CM | POA: Diagnosis present

## 2021-07-23 NOTE — Therapy (Signed)
[facility] [address] [place], [place], [address] Phone: [phone]   Fax:  [phone]  Physical Therapy Treatment  Patient Details  Name: Tammie Grimes MRN: [id number] Date of Birth: 1975-12-28 Referring Provider (PT): Rosette Reveal, MD   Encounter Date: 07/23/2021   PT End of Session - 07/24/21 1303     Visit Number 9    Number of Visits 21    Date for PT Re-Evaluation 09/03/21    Authorization - Visit Number 9    Authorization - Number of Visits 10    Progress Note Due on Visit 10    PT Start Time 6384    PT Stop Time 1348    PT Time Calculation (min) 49 min    Activity Tolerance Patient tolerated treatment well    Behavior During Therapy [facility] for tasks assessed/performed             Past Medical History:  Diagnosis Date   Allergy     History reviewed. No pertinent surgical history.  There were no vitals filed for this visit.   Subjective Assessment - 07/24/21 1034     Subjective Pt. has not been to PT in approximately 1 month due to holidays and Covid after New Years.  Pt. reports feeling better after a difficult 1st couple of weeks due to Covid.  Pt. reports she received injections in knee prior to holidays.  Pt. has recently returned to aquatic ex./ gym based ex.  PT discussed pts. frequency with exercise/ gym based program.    Pertinent History PMH with h/o depression, BMI: 50-59.9, Injection 04/16/2021, and plantar fasciitis. Pt would like to return to [employer] for independent exercise soon.    Limitations Walking;Lifting;Standing    How long can you sit comfortably? WNL    How long can you stand comfortably? 10 mins    How long can you walk comfortably? 1 hour    Diagnostic tests x-rays dated 03/31/2021 reveal mild-moderate tricompartmental degenerative changes    Patient Stated Goals Need to be able to squat and lift for work. Able to climb stairs. Longer bouts of standing on feet.     Currently in Pain? Yes    Pain Score 2     Pain Location Knee    Pain Orientation Right;Left    Pain Onset More than a month ago                [facility] PT Assessment - 07/24/21 0001       Assessment   Medical Diagnosis Primary osteoarthritis of right and left  knee    Referring Provider (PT) Rosette Reveal, MD    Onset Date/Surgical Date 01/04/21    Hand Dominance Right      Balance Screen   Has the patient fallen in the past 6 months No      [place] residence      Prior Function   Level of Independence Independent                 Pt. Had Covid after New Years and was in bed for over a week.   No c/o knee pain prior to tx. Session.  Pt. Reports increase L knee pain over past couple weeks.     There.ex:    Nustep L4 B LE only for 10 minutes.  Discussed gym based ex.    Walking in hallway with consistent step pattern/ heel strike.  Resisted gait 2BTB 5x all 4-planes (mirror feedback/ cuing for posture).   Partial lunges L/R in //-bars with modified technique (increase knee pain).     Manual tx.:  Supine R lower leg LAD 5x.  Supine R proximal tibia AP grade II-III mobs. (As tolerated)- increase L knee joint line tenderness.  Seated R/L knee LAD 3x            Supine hamstring/ gastroc stretches 3x each.      PT Short Term Goals - 06/05/21 1335       PT SHORT TERM GOAL #1   Title Pt reports appropirate return to public gym at least 2x a week without increase in abnormal pain or soreness in bilat LE.    Baseline Pt is currently not activity engage in gym based exercises 2/2 to fear other additional bilat knee pain flare up.    Time 4    Period Weeks    Status Achieved    Target Date 06/05/21               PT Long Term Goals - 07/23/21 1321       PT LONG TERM GOAL #1   Title Pt will obtained a FOTO score of 70 to measure self-reported improvement during functional ADLs    Baseline 52. 1/17: 55  (pt. reports limited compliance with HEP and starting to be more active in pool recently).    Time 6    Period Weeks    Status Not Met    Target Date 09/03/21      PT LONG TERM GOAL #2   Title Pt will increase strength of by at least 1/2 MMT grade in order to demonstrate improvement in strength and function.    Baseline MMT(R/L): 4/4 Hip flexion. 4/4 Hip ER. 5/5 Hip IR. 5/5 Hip abduction. 5/5 Hip adduction. 4/4 Knee extension.  4/4 Knee flexion * pain with L knee flexion*. 5/5 Ankle dorsiflexion.  1/17: hip flexion 4/5 MMT, abduction 5/5 MMT, knee extension 4+/5 MMT (no pain),    Time 6    Period Weeks    Status Partially Met    Target Date 09/03/21      PT LONG TERM GOAL #3   Title Pt will be independent with HEP in order to decrease worse bilat knee pain by atleast 4/10 NPS in order to improve pain-free function at home and work.    Baseline Worse pain 10/10    Time 8    Period Weeks    Status Not Met    Target Date 09/03/21      PT LONG TERM GOAL #4   Title Pt will be able to perfrom a box lift of atleast 30# with proper form and pain less than 2/10 10 times.    Baseline Pt was able to lift 17 # box with proper form with verbal cueing.  1/17: 20# floor to waist lift with good technique but pain.    Time 8    Period Weeks    Status Partially Met    Target Date 09/03/21      PT LONG TERM GOAL #5   Title Pt will be able to complete at least 12 stairs without knee pain and proper form to allow stair amb for work related tasks    Baseline 4 steps x3 without rails. Slight increase in pain in the R knee compared to the L. Slight toe out of L foot compared to R.  1/17: Recip. gait on  stairs with use of B handrails and increase discomfort with discomfort.    Time 8    Period Weeks    Status Partially Met    Target Date 09/03/21                   Plan - 07/24/21 1311     Clinical Impression Statement Pt. continues to present with R knee medial meniscus inflammation/  clicking noted during wt. bearing. (+) R knee joint line tenderness noted as compared to L knee.   Frequent R knee "popping/ clicking" with standing marching/ hip abduction and walking/pivoting in clinic.  Pt. reports minimal change in knee symptoms/ pain since injections a few weeks ago. PT reviewed current HEP and frequency/ intensity of gym based and aquatic ex. See updated PT goals.  Pt. will continue to benefit from skilled PT services 1-2x/week with progression of LE strengthening to improve pain-free mobility.    Personal Factors and Comorbidities Comorbidity 3+;Past/Current Experience;Profession    Examination-Activity Limitations Stairs;Stand;Transfers;Locomotion Level;Squat;Lift    Examination-Participation Restrictions Occupation    Stability/Clinical Decision Making Evolving/Moderate complexity    Clinical Decision Making Moderate    Rehab Potential Good    PT Frequency 2x / week    PT Duration 6 weeks    PT Treatment/Interventions Aquatic Therapy;Biofeedback;Electrical Stimulation;Traction;Moist Heat;Ultrasound;Gait training;Stair training;Functional mobility training;Therapeutic activities;Therapeutic exercise;Balance training;Neuromuscular re-education;Passive range of motion;Dry needling;Energy conservation;Joint Manipulations;Spinal Manipulations    PT Next Visit Plan R LE strengthening.    PT Home Exercise Plan [email or web address]    Consulted and Agree with Plan of Care Patient             Patient will benefit from skilled therapeutic intervention in order to improve the following deficits and impairments:  Abnormal gait, Decreased balance, Decreased activity tolerance, Decreased endurance, Decreased coordination, Decreased mobility, Decreased range of motion, Hypomobility, Decreased strength, Impaired flexibility, Improper body mechanics, Pain, Obesity, Postural dysfunction  Visit Diagnosis: Chronic pain of right knee  Impaired strength of lower extremity  Chronic pain of left  knee  Bilateral primary osteoarthritis of knee     Problem List Patient Active Problem List   Diagnosis Date Noted   Class 3 severe obesity with serious comorbidity and body mass index (BMI) of 45.0 to 49.9 in adult ([place]) 06/27/2021   Flu vaccine need 05/20/2021   Primary osteoarthritis of right knee 04/10/2021   Primary osteoarthritis of left knee 04/10/2021   BMI 50.0-59.9, adult ([place]) 04/10/2021   Plantar fasciitis 10/23/2020   Allergic rhinitis 05/24/2019   Allergy 05/24/2019   Depo-Provera contraceptive status 05/24/2019   Depression 05/24/2019   Pura Spice, PT, DPT # [phone] 07/24/2021, 2:00 PM  [facility] [address] [place], [place], [address] Phone: [phone]   Fax:  [phone]  Name: JADE BURKARD MRN: [id number] Date of Birth: 08-21-1975

## 2021-07-25 ENCOUNTER — Ambulatory Visit: Payer: PRIVATE HEALTH INSURANCE | Admitting: Family Medicine

## 2021-07-26 ENCOUNTER — Encounter: Payer: Self-pay | Admitting: Family Medicine

## 2021-07-26 DIAGNOSIS — M1712 Unilateral primary osteoarthritis, left knee: Secondary | ICD-10-CM

## 2021-07-26 DIAGNOSIS — M1711 Unilateral primary osteoarthritis, right knee: Secondary | ICD-10-CM

## 2021-07-26 MED ORDER — DULOXETINE HCL 60 MG PO CPEP: 60.0000 mg | ORAL_CAPSULE | Freq: Every day | ORAL | 2 refills | Status: DC

## 2021-07-26 MED ORDER — DULOXETINE HCL 30 MG PO CPEP: 30.0000 mg | ORAL_CAPSULE | Freq: Every evening | ORAL | 0 refills | Status: DC

## 2021-07-26 NOTE — Telephone Encounter (Signed)
Please review.  KP

## 2021-07-29 ENCOUNTER — Other Ambulatory Visit: Payer: Self-pay

## 2021-07-29 ENCOUNTER — Ambulatory Visit: Payer: PRIVATE HEALTH INSURANCE | Admitting: Physical Therapy

## 2021-07-29 ENCOUNTER — Encounter: Payer: Self-pay | Admitting: Physical Therapy

## 2021-07-29 DIAGNOSIS — G8929 Other chronic pain: Secondary | ICD-10-CM

## 2021-07-29 DIAGNOSIS — M25561 Pain in right knee: Secondary | ICD-10-CM | POA: Diagnosis not present

## 2021-07-29 DIAGNOSIS — R29898 Other symptoms and signs involving the musculoskeletal system: Secondary | ICD-10-CM

## 2021-07-29 DIAGNOSIS — M170 Bilateral primary osteoarthritis of knee: Secondary | ICD-10-CM

## 2021-07-29 NOTE — Therapy (Signed)
[facility] [address] [place], [place], [address] Phone: [phone]   Fax:  [phone]  Physical Therapy Treatment Physical Therapy Progress Note   Dates of reporting period  04/24/2021  to 07/30/2021  Patient Details  Name: Tammie Grimes MRN: [id number] Date of Birth: [birth year] Referring Provider (PT): Rosette Reveal, MD   Encounter Date: 07/29/2021   PT End of Session - 07/30/21 0747     Visit Number 10    Number of Visits 21    Date for PT Re-Evaluation 09/03/21    Authorization - Visit Number 10    Authorization - Number of Visits 10    Progress Note Due on Visit 10    PT Start Time 1301    PT Stop Time 4010    PT Time Calculation (min) 51 min    Activity Tolerance Patient tolerated treatment well    Behavior During Therapy [facility] for tasks assessed/performed             Past Medical History:  Diagnosis Date   Allergy     History reviewed. No pertinent surgical history.  There were no vitals filed for this visit.   Subjective Assessment - 07/29/21 1308     Subjective Pt. reports no new complaints.  Pt. has been compliant and consistent with gym based ex.  Pt. is scheduled to participate with aquatic ex. Monday, Wednesday and Friday.  Pt. undertstands the importance of a recovery day to decrease inflammation in knee.    Pertinent History PMH with h/o depression, BMI: 50-59.9, Injection 04/16/2021, and plantar fasciitis. Pt would like to return to [employer] for independent exercise soon.    Limitations Walking;Lifting;Standing    How long can you sit comfortably? WNL    How long can you stand comfortably? 10 mins    How long can you walk comfortably? 1 hour    Diagnostic tests x-rays dated 03/31/2021 reveal mild-moderate tricompartmental degenerative changes    Patient Stated Goals Need to be able to squat and lift for work. Able to climb stairs. Longer bouts of standing on feet.    Currently in  Pain? No/denies    Pain Onset More than a month ago              There.ex:    Nustep L5 B LE only for 10 minutes.  Discussed gym based ex./ weekend activities.     TG knee flexion 20x/ toe in 10x/ toe out 10x.  Heel raises 20x.    Prostretch:  3x 20-30 seconds.    Resisted gait 2BTB 5x all 4-planes (mirror feedback/ cuing for posture).     Manual tx.:   Seated R/L knee LAD 3x.     Supine hamstring/ gastroc/ piriformis 3x20 sec. Each.  L/R sidelying ITB and rectus stretches 3x each with holds.    Supine R lower leg LAD 5x.  Supine R proximal tibia AP grade II-III mobs. (As tolerated)- increase L knee joint line tenderness.           Supine hamstring/ gastroc stretches 3x each.        PT Short Term Goals - 06/05/21 1335       PT SHORT TERM GOAL #1   Title Pt reports appropirate return to public gym at least 2x a week without increase in abnormal pain or soreness in bilat LE.    Baseline Pt is currently not activity engage in gym based exercises 2/2 to fear other additional  bilat knee pain flare up.    Time 4    Period Weeks    Status Achieved    Target Date 06/05/21               PT Long Term Goals - 07/23/21 1321       PT LONG TERM GOAL #1   Title Pt will obtained a FOTO score of 70 to measure self-reported improvement during functional ADLs    Baseline 52. 1/17: 55 (pt. reports limited compliance with HEP and starting to be more active in pool recently).    Time 6    Period Weeks    Status Not Met    Target Date 09/03/21      PT LONG TERM GOAL #2   Title Pt will increase strength of by at least 1/2 MMT grade in order to demonstrate improvement in strength and function.    Baseline MMT(R/L): 4/4 Hip flexion. 4/4 Hip ER. 5/5 Hip IR. 5/5 Hip abduction. 5/5 Hip adduction. 4/4 Knee extension.  4/4 Knee flexion * pain with L knee flexion*. 5/5 Ankle dorsiflexion.  1/17: hip flexion 4/5 MMT, abduction 5/5 MMT, knee extension 4+/5 MMT (no pain),    Time 6     Period Weeks    Status Partially Met    Target Date 09/03/21      PT LONG TERM GOAL #3   Title Pt will be independent with HEP in order to decrease worse bilat knee pain by atleast 4/10 NPS in order to improve pain-free function at home and work.    Baseline Worse pain 10/10    Time 8    Period Weeks    Status Not Met    Target Date 09/03/21      PT LONG TERM GOAL #4   Title Pt will be able to perfrom a box lift of atleast 30# with proper form and pain less than 2/10 10 times.    Baseline Pt was able to lift 17 # box with proper form with verbal cueing.  1/17: 20# floor to waist lift with good technique but pain.    Time 8    Period Weeks    Status Partially Met    Target Date 09/03/21      PT LONG TERM GOAL #5   Title Pt will be able to complete at least 12 stairs without knee pain and proper form to allow stair amb for work related tasks    Baseline 4 steps x3 without rails. Slight increase in pain in the R knee compared to the L. Slight toe out of L foot compared to R.  1/17: Recip. gait on stairs with use of B handrails and increase discomfort with discomfort.    Time 8    Period Weeks    Status Partially Met    Target Date 09/03/21                 Plan - 07/30/21 0749     Clinical Impression Statement Pt. progressing well with hip/LE strengthening ex. with proper technique/ form.  Pt. had several episodes of audible R knee "popping" during gait/ squats.  Pt. will continue with aquatic ex. 3x/week in combination with land based ther.ex.  Pt. instructed in gastroc stretches with use of prostretch at wall with static holds.  Good tx. tolerance with knee joint mobs./ LAD.  Pt. will continue to benefit from skilled PT services to increase LE strengthening to improve pain-free mobility.    Personal  Factors and Comorbidities Comorbidity 3+;Past/Current Experience;Profession    Examination-Activity Limitations Stairs;Stand;Transfers;Locomotion Level;Squat;Lift     Examination-Participation Restrictions Occupation    Stability/Clinical Decision Making Evolving/Moderate complexity    Clinical Decision Making Moderate    Rehab Potential Good    PT Frequency 2x / week    PT Duration 6 weeks    PT Treatment/Interventions Aquatic Therapy;Biofeedback;Electrical Stimulation;Traction;Moist Heat;Ultrasound;Gait training;Stair training;Functional mobility training;Therapeutic activities;Therapeutic exercise;Balance training;Neuromuscular re-education;Passive range of motion;Dry needling;Energy conservation;Joint Manipulations;Spinal Manipulations    PT Next Visit Plan R LE strengthening.    PT Home Exercise Plan [email or web address]    Consulted and Agree with Plan of Care Patient             Patient will benefit from skilled therapeutic intervention in order to improve the following deficits and impairments:  Abnormal gait, Decreased balance, Decreased activity tolerance, Decreased endurance, Decreased coordination, Decreased mobility, Decreased range of motion, Hypomobility, Decreased strength, Impaired flexibility, Improper body mechanics, Pain, Obesity, Postural dysfunction  Visit Diagnosis: Chronic pain of right knee  Impaired strength of lower extremity  Chronic pain of left knee  Bilateral primary osteoarthritis of knee     Problem List Patient Active Problem List   Diagnosis Date Noted   Class 3 severe obesity with serious comorbidity and body mass index (BMI) of 45.0 to 49.9 in adult ([place]) 06/27/2021   Flu vaccine need 05/20/2021   Primary osteoarthritis of right knee 04/10/2021   Primary osteoarthritis of left knee 04/10/2021   BMI 50.0-59.9, adult ([place]) 04/10/2021   Plantar fasciitis 10/23/2020   Allergic rhinitis 05/24/2019   Allergy 05/24/2019   Depo-Provera contraceptive status 05/24/2019   Depression 05/24/2019   Pura Spice, PT, DPT # [phone] 07/30/2021, 1:35 PM  [facility] [address] [place], [place], [address] Phone: [phone]   Fax:  [phone]  Name: Tammie Grimes MRN: [id number] Date of Birth: [birth year]

## 2021-08-06 ENCOUNTER — Ambulatory Visit: Payer: PRIVATE HEALTH INSURANCE | Admitting: Physical Therapy

## 2021-08-06 ENCOUNTER — Encounter: Payer: Self-pay | Admitting: Physical Therapy

## 2021-08-06 ENCOUNTER — Other Ambulatory Visit: Payer: Self-pay

## 2021-08-06 DIAGNOSIS — M170 Bilateral primary osteoarthritis of knee: Secondary | ICD-10-CM

## 2021-08-06 DIAGNOSIS — M25561 Pain in right knee: Secondary | ICD-10-CM | POA: Diagnosis not present

## 2021-08-06 DIAGNOSIS — R29898 Other symptoms and signs involving the musculoskeletal system: Secondary | ICD-10-CM

## 2021-08-06 DIAGNOSIS — G8929 Other chronic pain: Secondary | ICD-10-CM

## 2021-08-06 NOTE — Therapy (Signed)
[facility] [address] [place], [place], [address] Phone: [phone]   Fax:  [phone]  Physical Therapy Treatment  Patient Details  Name: Tammie Grimes MRN: [id number] Date of Birth: 1976/03/19 Referring Provider (PT): Rosette Reveal, MD   Encounter Date: 08/06/2021   PT End of Session - 08/06/21 1310     Visit Number 11    Number of Visits 21    Date for PT Re-Evaluation 09/03/21    Authorization - Visit Number 1    Authorization - Number of Visits 10    Progress Note Due on Visit 10    PT Start Time 1779    PT Stop Time 1349    PT Time Calculation (min) 50 min    Activity Tolerance Patient tolerated treatment well    Behavior During Therapy [facility] for tasks assessed/performed             Past Medical History:  Diagnosis Date   Allergy     History reviewed. No pertinent surgical history.  There were no vitals filed for this visit.   Subjective Assessment - 08/06/21 1305     Subjective Pt. attended aquatic ex. this morning.  Pt. reports no c/o knee pain at this time.    Pertinent History PMH with h/o depression, BMI: 50-59.9, Injection 04/16/2021, and plantar fasciitis. Pt would like to return to [employer] for independent exercise soon.    Limitations Walking;Lifting;Standing    How long can you sit comfortably? WNL    How long can you stand comfortably? 10 mins    How long can you walk comfortably? 1 hour    Diagnostic tests x-rays dated 03/31/2021 reveal mild-moderate tricompartmental degenerative changes    Patient Stated Goals Need to be able to squat and lift for work. Able to climb stairs. Longer bouts of standing on feet.    Currently in Pain? No/denies    Pain Onset More than a month ago              There.ex:    Nustep L5 B LE only for 10 minutes.  Discussed work/ aquatic ex.     BOSU lunges in //-bars L/R with static holds.   Resisted gait 2BTB 5x all 4-planes (mirror feedback/  cuing for posture).  TG knee flexion 20x/ staggered stance (2x10)/ toe in 10x/ toe out 10x.  Heel raises 20x.     Step ups/down on 1st step with eccentric quad control/focus 10x each.  Standing gastroc stretches with holds (no bouncing).       Manual tx.:   Seated R/L knee LAD 3x.      Supine hamstring/ gastroc/ piriformis 3x20 sec. Each.  L/R sidelying ITB and rectus stretches 3x each with holds.     Supine R lower leg LAD 5x.  Supine R proximal tibia AP grade II-III mobs. (As tolerated)- increase L knee joint line tenderness.           Supine hamstring/ gastroc stretches 3x each.         PT Short Term Goals - 06/05/21 1335       PT SHORT TERM GOAL #1   Title Pt reports appropirate return to public gym at least 2x a week without increase in abnormal pain or soreness in bilat LE.    Baseline Pt is currently not activity engage in gym based exercises 2/2 to fear other additional bilat knee pain flare up.    Time 4  Period Weeks    Status Achieved    Target Date 06/05/21               PT Long Term Goals - 07/23/21 1321       PT LONG TERM GOAL #1   Title Pt will obtained a FOTO score of 70 to measure self-reported improvement during functional ADLs    Baseline 52. 1/17: 55 (pt. reports limited compliance with HEP and starting to be more active in pool recently).    Time 6    Period Weeks    Status Not Met    Target Date 09/03/21      PT LONG TERM GOAL #2   Title Pt will increase strength of by at least 1/2 MMT grade in order to demonstrate improvement in strength and function.    Baseline MMT(R/L): 4/4 Hip flexion. 4/4 Hip ER. 5/5 Hip IR. 5/5 Hip abduction. 5/5 Hip adduction. 4/4 Knee extension.  4/4 Knee flexion * pain with L knee flexion*. 5/5 Ankle dorsiflexion.  1/17: hip flexion 4/5 MMT, abduction 5/5 MMT, knee extension 4+/5 MMT (no pain),    Time 6    Period Weeks    Status Partially Met    Target Date 09/03/21      PT LONG TERM GOAL #3   Title Pt will  be independent with HEP in order to decrease worse bilat knee pain by atleast 4/10 NPS in order to improve pain-free function at home and work.    Baseline Worse pain 10/10    Time 8    Period Weeks    Status Not Met    Target Date 09/03/21      PT LONG TERM GOAL #4   Title Pt will be able to perfrom a box lift of atleast 30# with proper form and pain less than 2/10 10 times.    Baseline Pt was able to lift 17 # box with proper form with verbal cueing.  1/17: 20# floor to waist lift with good technique but pain.    Time 8    Period Weeks    Status Partially Met    Target Date 09/03/21      PT LONG TERM GOAL #5   Title Pt will be able to complete at least 12 stairs without knee pain and proper form to allow stair amb for work related tasks    Baseline 4 steps x3 without rails. Slight increase in pain in the R knee compared to the L. Slight toe out of L foot compared to R.  1/17: Recip. gait on stairs with use of B handrails and increase discomfort with discomfort.    Time 8    Period Weeks    Status Partially Met    Target Date 09/03/21                 Plan - 08/06/21 1311     Clinical Impression Statement Pt. did really well this afternoon with LE/ quad strengthening ex. and was challenged with BOSU lunges.  No c/o gastroc discomfort today during standing ther.ex./ stretches.  Pt. will continue to benefit from a consistent gym based/ aquatic ex. program to increase strength/ functional mobility.  No change to HEP at this time.    Personal Factors and Comorbidities Comorbidity 3+;Past/Current Experience;Profession    Examination-Activity Limitations Stairs;Stand;Transfers;Locomotion Level;Squat;Lift    Examination-Participation Restrictions Occupation    Stability/Clinical Decision Making Evolving/Moderate complexity    Clinical Decision Making Moderate    Rehab Potential  Good    PT Frequency 2x / week    PT Duration 6 weeks    PT Treatment/Interventions Aquatic  Therapy;Biofeedback;Electrical Stimulation;Traction;Moist Heat;Ultrasound;Gait training;Stair training;Functional mobility training;Therapeutic activities;Therapeutic exercise;Balance training;Neuromuscular re-education;Passive range of motion;Dry needling;Energy conservation;Joint Manipulations;Spinal Manipulations    PT Next Visit Plan R LE strengthening.    PT Home Exercise Plan [id number]    Consulted and Agree with Plan of Care Patient             Patient will benefit from skilled therapeutic intervention in order to improve the following deficits and impairments:  Abnormal gait, Decreased balance, Decreased activity tolerance, Decreased endurance, Decreased coordination, Decreased mobility, Decreased range of motion, Hypomobility, Decreased strength, Impaired flexibility, Improper body mechanics, Pain, Obesity, Postural dysfunction  Visit Diagnosis: Chronic pain of right knee  Impaired strength of lower extremity  Chronic pain of left knee  Bilateral primary osteoarthritis of knee     Problem List Patient Active Problem List   Diagnosis Date Noted   Class 3 severe obesity with serious comorbidity and body mass index (BMI) of 45.0 to 49.9 in adult ([place]) 06/27/2021   Flu vaccine need 05/20/2021   Primary osteoarthritis of right knee 04/10/2021   Primary osteoarthritis of left knee 04/10/2021   BMI 50.0-59.9, adult ([place]) 04/10/2021   Plantar fasciitis 10/23/2020   Allergic rhinitis 05/24/2019   Allergy 05/24/2019   Depo-Provera contraceptive status 05/24/2019   Depression 05/24/2019   Pura Spice, PT, DPT # [id number] 08/06/2021, 2:56 PM   [facility] [address]. [place], [place], [address] Phone: [phone]   Fax:  [phone]  Name: Tammie Grimes MRN: [id number] Date of Birth: 10/23/1975

## 2021-08-12 ENCOUNTER — Other Ambulatory Visit: Payer: Self-pay

## 2021-08-12 ENCOUNTER — Ambulatory Visit: Payer: PRIVATE HEALTH INSURANCE | Attending: Family Medicine | Admitting: Physical Therapy

## 2021-08-12 ENCOUNTER — Encounter: Payer: Self-pay | Admitting: Physical Therapy

## 2021-08-12 DIAGNOSIS — G8929 Other chronic pain: Secondary | ICD-10-CM | POA: Diagnosis present

## 2021-08-12 DIAGNOSIS — M170 Bilateral primary osteoarthritis of knee: Secondary | ICD-10-CM | POA: Diagnosis present

## 2021-08-12 DIAGNOSIS — M25561 Pain in right knee: Secondary | ICD-10-CM | POA: Insufficient documentation

## 2021-08-12 DIAGNOSIS — R29898 Other symptoms and signs involving the musculoskeletal system: Secondary | ICD-10-CM | POA: Diagnosis present

## 2021-08-12 DIAGNOSIS — M25562 Pain in left knee: Secondary | ICD-10-CM | POA: Diagnosis present

## 2021-08-12 NOTE — Therapy (Signed)
[place] [facility] [address] [place], [place], [address] Phone: [phone]   Fax:  [phone]  Physical Therapy Treatment  Patient Details  Name: Tammie Grimes MRN: [id number] Date of Birth: 10-May-1976 Referring Provider (PT): Rosette Reveal, MD   Encounter Date: 08/12/2021   PT End of Session - 08/12/21 1305     Visit Number 12    Number of Visits 21    Date for PT Re-Evaluation 09/03/21    Authorization - Visit Number 2    Authorization - Number of Visits 10    Progress Note Due on Visit 10    PT Start Time 1257    PT Stop Time 1345    PT Time Calculation (min) 48 min    Activity Tolerance Patient tolerated treatment well    Behavior During Therapy [facility] for tasks assessed/performed             Past Medical History:  Diagnosis Date   Allergy     History reviewed. No pertinent surgical history.  There were no vitals filed for this visit.   Subjective Assessment - 08/13/21 0722     Subjective Pt. states she had an active weekend at a work-related class on Saturday.  Pt. rested on Sunday and feels stiff all over today.  Pt. attended aquatic ex. this morning.  Pt. reports stiffness in B knees today and returns to work tomorrow.    Pertinent History PMH with h/o depression, BMI: 50-59.9, Injection 04/16/2021, and plantar fasciitis. Pt would like to return to [employer] for independent exercise soon.    Limitations Walking;Lifting;Standing    How long can you sit comfortably? WNL    How long can you stand comfortably? 10 mins    How long can you walk comfortably? 1 hour    Diagnostic tests x-rays dated 03/31/2021 reveal mild-moderate tricompartmental degenerative changes    Patient Stated Goals Need to be able to squat and lift for work. Able to climb stairs. Longer bouts of standing on feet.    Currently in Pain? Yes    Pain Score 3     Pain Location Knee    Pain Orientation Right;Left    Pain Onset More than a  month ago                There.ex:    Nustep L5 B LE only for 12 minutes.  Discussed weekend activities/ work related tasks.  Airex: marching/ tandem stance.    Prostretch: 3 x 30 sec. Each.  Pain limited in gastroc reported.    Supine SLR 10x/ knee flexion (reassessment of AROM).    Discussed gym based/ HEP     Manual tx.:     Supine hamstring/ gastroc/ piriformis 3x20 sec. Each.  L/R sidelying ITB and rectus stretches 3x each with holds.     Supine R lower leg LAD 5x.  Supine R proximal tibia AP grade II-III mobs. (As tolerated).  Supine R/L plantar fascia STM and great toe extension 3x each.  Joint stiffness noted.            PT Short Term Goals - 06/05/21 1335       PT SHORT TERM GOAL #1   Title Pt reports appropirate return to public gym at least 2x a week without increase in abnormal pain or soreness in bilat LE.    Baseline Pt is currently not activity engage in gym based exercises 2/2 to fear other additional bilat knee pain  flare up.    Time 4    Period Weeks    Status Achieved    Target Date 06/05/21               PT Long Term Goals - 07/23/21 1321       PT LONG TERM GOAL #1   Title Pt will obtained a FOTO score of 70 to measure self-reported improvement during functional ADLs    Baseline 52. 1/17: 55 (pt. reports limited compliance with HEP and starting to be more active in pool recently).    Time 6    Period Weeks    Status Not Met    Target Date 09/03/21      PT LONG TERM GOAL #2   Title Pt will increase strength of by at least 1/2 MMT grade in order to demonstrate improvement in strength and function.    Baseline MMT(R/L): 4/4 Hip flexion. 4/4 Hip ER. 5/5 Hip IR. 5/5 Hip abduction. 5/5 Hip adduction. 4/4 Knee extension.  4/4 Knee flexion * pain with L knee flexion*. 5/5 Ankle dorsiflexion.  1/17: hip flexion 4/5 MMT, abduction 5/5 MMT, knee extension 4+/5 MMT (no pain),    Time 6    Period Weeks    Status Partially Met    Target Date  09/03/21      PT LONG TERM GOAL #3   Title Pt will be independent with HEP in order to decrease worse bilat knee pain by atleast 4/10 NPS in order to improve pain-free function at home and work.    Baseline Worse pain 10/10    Time 8    Period Weeks    Status Not Met    Target Date 09/03/21      PT LONG TERM GOAL #4   Title Pt will be able to perfrom a box lift of atleast 30# with proper form and pain less than 2/10 10 times.    Baseline Pt was able to lift 17 # box with proper form with verbal cueing.  1/17: 20# floor to waist lift with good technique but pain.    Time 8    Period Weeks    Status Partially Met    Target Date 09/03/21      PT LONG TERM GOAL #5   Title Pt will be able to complete at least 12 stairs without knee pain and proper form to allow stair amb for work related tasks    Baseline 4 steps x3 without rails. Slight increase in pain in the R knee compared to the L. Slight toe out of L foot compared to R.  1/17: Recip. gait on stairs with use of B handrails and increase discomfort with discomfort.    Time 8    Period Weeks    Status Partially Met    Target Date 09/03/21                   Plan - 08/13/21 0736     Clinical Impression Statement Slight increase in knee discomfort reported prior/ during tx. session as compared to previous tx. session.  Good B knee mobility and progression in strengthening ex.  L plantar fascia tenderness with palpation along arch of foot and increase discomfort in gastroc during prostretch/ BOSU ex.  Pt. will continue to benefit from skilled PT services to progress stretching/ strengthening ex. while pt. continues to aquatic/ gym based program.    Personal Factors and Comorbidities Comorbidity 3+;Past/Current Experience;Profession    Examination-Activity Limitations Stairs;Stand;Transfers;Locomotion  Level;Squat;Lift    Examination-Participation Restrictions Occupation    Stability/Clinical Decision Making Evolving/Moderate  complexity    Clinical Decision Making Moderate    Rehab Potential Good    PT Frequency 2x / week    PT Duration 6 weeks    PT Treatment/Interventions Aquatic Therapy;Biofeedback;Electrical Stimulation;Traction;Moist Heat;Ultrasound;Gait training;Stair training;Functional mobility training;Therapeutic activities;Therapeutic exercise;Balance training;Neuromuscular re-education;Passive range of motion;Dry needling;Energy conservation;Joint Manipulations;Spinal Manipulations    PT Next Visit Plan R LE strengthening.    PT Home Exercise Plan [id number]    Consulted and Agree with Plan of Care Patient             Patient will benefit from skilled therapeutic intervention in order to improve the following deficits and impairments:  Abnormal gait, Decreased balance, Decreased activity tolerance, Decreased endurance, Decreased coordination, Decreased mobility, Decreased range of motion, Hypomobility, Decreased strength, Impaired flexibility, Improper body mechanics, Pain, Obesity, Postural dysfunction  Visit Diagnosis: Chronic pain of right knee  Impaired strength of lower extremity  Chronic pain of left knee  Bilateral primary osteoarthritis of knee     Problem List Patient Active Problem List   Diagnosis Date Noted   Class 3 severe obesity with serious comorbidity and body mass index (BMI) of 45.0 to 49.9 in adult ([place]) 06/27/2021   Flu vaccine need 05/20/2021   Primary osteoarthritis of right knee 04/10/2021   Primary osteoarthritis of left knee 04/10/2021   BMI 50.0-59.9, adult ([place]) 04/10/2021   Plantar fasciitis 10/23/2020   Allergic rhinitis 05/24/2019   Allergy 05/24/2019   Depo-Provera contraceptive status 05/24/2019   Depression 05/24/2019   Pura Spice, PT, DPT # [phone] 08/13/2021, 6:17 PM  [place] [facility] [address] [place], [place], [address] Phone: [phone]   Fax:  [phone]  Name: LAILANI TOOL MRN:  [id number] Date of Birth: 06/17/1976

## 2021-08-14 ENCOUNTER — Encounter: Payer: PRIVATE HEALTH INSURANCE | Admitting: Physical Therapy

## 2021-08-20 ENCOUNTER — Ambulatory Visit: Payer: PRIVATE HEALTH INSURANCE | Admitting: Physical Therapy

## 2021-08-20 ENCOUNTER — Other Ambulatory Visit: Payer: Self-pay

## 2021-08-20 ENCOUNTER — Encounter: Payer: Self-pay | Admitting: Physical Therapy

## 2021-08-20 DIAGNOSIS — M25562 Pain in left knee: Secondary | ICD-10-CM

## 2021-08-20 DIAGNOSIS — M170 Bilateral primary osteoarthritis of knee: Secondary | ICD-10-CM

## 2021-08-20 DIAGNOSIS — G8929 Other chronic pain: Secondary | ICD-10-CM

## 2021-08-20 DIAGNOSIS — R29898 Other symptoms and signs involving the musculoskeletal system: Secondary | ICD-10-CM

## 2021-08-20 DIAGNOSIS — M25561 Pain in right knee: Secondary | ICD-10-CM | POA: Diagnosis not present

## 2021-08-21 NOTE — Therapy (Signed)
[place] [facility] [address] [place], [place], [address] Phone: [phone]   Fax:  [phone]  Physical Therapy Treatment  Patient Details  Name: Tammie Grimes MRN: [id number] Date of Birth: 08/07/1975 Referring Provider (PT): Rosette Reveal, MD   Encounter Date: 08/20/2021   PT End of Session - 08/21/21 0842     Visit Number 13    Number of Visits 21    Date for PT Re-Evaluation 09/03/21    Authorization - Visit Number 3    Authorization - Number of Visits 10    Progress Note Due on Visit 10    PT Start Time 9242    PT Stop Time 1432    PT Time Calculation (min) 44 min    Activity Tolerance Patient tolerated treatment well    Behavior During Therapy [facility] for tasks assessed/performed             Past Medical History:  Diagnosis Date   Allergy     History reviewed. No pertinent surgical history.  There were no vitals filed for this visit.   Subjective Assessment - 08/20/21 1407     Subjective Pt. reports she is doing well.  No new complaints.    Pertinent History PMH with h/o depression, BMI: 50-59.9, Injection 04/16/2021, and plantar fasciitis. Pt would like to return to [employer] for independent exercise soon.    Limitations Walking;Lifting;Standing    How long can you sit comfortably? WNL    How long can you stand comfortably? 10 mins    How long can you walk comfortably? 1 hour    Diagnostic tests x-rays dated 03/31/2021 reveal mild-moderate tricompartmental degenerative changes    Patient Stated Goals Need to be able to squat and lift for work. Able to climb stairs. Longer bouts of standing on feet.    Currently in Pain? No/denies    Pain Score --    Pain Location --    Pain Orientation --    Pain Onset More than a month ago              There.ex:    High marching in //-bars (2 laps).  Resisted gait 2BTB 5x all 4-planes (min. To no UE assist).  NBOS rebounder on Airex 20x.    Airex at  star with SLS cone taps (with and without Airex)- min. LOB with Airex (SBA/CGA for safety).    Prostretch: 3 x 30 sec. Each.  Pain limited in gastroc reported.   Nustep L5 B LE only for 12 minutes.  Discussed weekend activities/ work related tasks.       Manual tx.:     Supine hamstring/ gastroc/ piriformis 3x20 sec. Each.     Supine R lower leg LAD 5x.  Supine R proximal tibia AP grade II-III mobs. (As tolerated).   Supine R/L plantar fascia STM and great toe extension 3x each.  Joint stiffness noted.            PT Short Term Goals - 06/05/21 1335       PT SHORT TERM GOAL #1   Title Pt reports appropirate return to public gym at least 2x a week without increase in abnormal pain or soreness in bilat LE.    Baseline Pt is currently not activity engage in gym based exercises 2/2 to fear other additional bilat knee pain flare up.    Time 4    Period Weeks    Status Achieved    Target  Date 06/05/21               PT Long Term Goals - 07/23/21 1321       PT LONG TERM GOAL #1   Title Pt will obtained a FOTO score of 70 to measure self-reported improvement during functional ADLs    Baseline 52. 1/17: 55 (pt. reports limited compliance with HEP and starting to be more active in pool recently).    Time 6    Period Weeks    Status Not Met    Target Date 09/03/21      PT LONG TERM GOAL #2   Title Pt will increase strength of by at least 1/2 MMT grade in order to demonstrate improvement in strength and function.    Baseline MMT(R/L): 4/4 Hip flexion. 4/4 Hip ER. 5/5 Hip IR. 5/5 Hip abduction. 5/5 Hip adduction. 4/4 Knee extension.  4/4 Knee flexion * pain with L knee flexion*. 5/5 Ankle dorsiflexion.  1/17: hip flexion 4/5 MMT, abduction 5/5 MMT, knee extension 4+/5 MMT (no pain),    Time 6    Period Weeks    Status Partially Met    Target Date 09/03/21      PT LONG TERM GOAL #3   Title Pt will be independent with HEP in order to decrease worse bilat knee pain by atleast  4/10 NPS in order to improve pain-free function at home and work.    Baseline Worse pain 10/10    Time 8    Period Weeks    Status Not Met    Target Date 09/03/21      PT LONG TERM GOAL #4   Title Pt will be able to perfrom a box lift of atleast 30# with proper form and pain less than 2/10 10 times.    Baseline Pt was able to lift 17 # box with proper form with verbal cueing.  1/17: 20# floor to waist lift with good technique but pain.    Time 8    Period Weeks    Status Partially Met    Target Date 09/03/21      PT LONG TERM GOAL #5   Title Pt will be able to complete at least 12 stairs without knee pain and proper form to allow stair amb for work related tasks    Baseline 4 steps x3 without rails. Slight increase in pain in the R knee compared to the L. Slight toe out of L foot compared to R.  1/17: Recip. gait on stairs with use of B handrails and increase discomfort with discomfort.    Time 8    Period Weeks    Status Partially Met    Target Date 09/03/21                Plan - 08/21/21 0843     Clinical Impression Statement Pt. progressing well with strengthening of LE and functional mobility.  PT will continue to focus on dynamic balance tasks and joint mobs. to increase ROM/ decrease pain.  Pt. works hard and remains motivated with tx. session to increase quad/LE strength.  Pt. has minimal c/o pain with joint mobs in seated/ supine position.  Slight increase c/o pain to 1/10 after tx.  Pt. will continue to benefit from skilled PT services to progress stretching/ strengthening ex. while pt. continues with aquatic/ gym based program.    Personal Factors and Comorbidities Comorbidity 3+;Past/Current Experience;Profession    Examination-Activity Limitations Stairs;Stand;Transfers;Locomotion Level;Squat;Lift    Examination-Participation Restrictions  Occupation    Stability/Clinical Decision Making Evolving/Moderate complexity    Clinical Decision Making Moderate    Rehab  Potential Good    PT Frequency 2x / week    PT Duration 6 weeks    PT Treatment/Interventions Aquatic Therapy;Biofeedback;Electrical Stimulation;Traction;Moist Heat;Ultrasound;Gait training;Stair training;Functional mobility training;Therapeutic activities;Therapeutic exercise;Balance training;Neuromuscular re-education;Passive range of motion;Dry needling;Energy conservation;Joint Manipulations;Spinal Manipulations    PT Next Visit Plan R LE strengthening.    PT Home Exercise Plan [id number]    Consulted and Agree with Plan of Care Patient             Patient will benefit from skilled therapeutic intervention in order to improve the following deficits and impairments:  Abnormal gait, Decreased balance, Decreased activity tolerance, Decreased endurance, Decreased coordination, Decreased mobility, Decreased range of motion, Hypomobility, Decreased strength, Impaired flexibility, Improper body mechanics, Pain, Obesity, Postural dysfunction  Visit Diagnosis: Chronic pain of right knee  Impaired strength of lower extremity  Chronic pain of left knee  Bilateral primary osteoarthritis of knee     Problem List Patient Active Problem List   Diagnosis Date Noted   Class 3 severe obesity with serious comorbidity and body mass index (BMI) of 45.0 to 49.9 in adult ([place]) 06/27/2021   Flu vaccine need 05/20/2021   Primary osteoarthritis of right knee 04/10/2021   Primary osteoarthritis of left knee 04/10/2021   BMI 50.0-59.9, adult ([place]) 04/10/2021   Plantar fasciitis 10/23/2020   Allergic rhinitis 05/24/2019   Allergy 05/24/2019   Depo-Provera contraceptive status 05/24/2019   Depression 05/24/2019   Pura Spice, PT, DPT # [id number] 08/21/2021, 9:22 AM  [place] [facility] [address] [place], [place], [address] Phone: [phone]   Fax:  [phone]  Name: Tammie Grimes MRN: [id number] Date of Birth: 1975/12/19

## 2021-08-26 ENCOUNTER — Other Ambulatory Visit: Payer: Self-pay

## 2021-08-26 ENCOUNTER — Encounter: Payer: Self-pay | Admitting: Physical Therapy

## 2021-08-26 ENCOUNTER — Ambulatory Visit: Payer: PRIVATE HEALTH INSURANCE | Admitting: Physical Therapy

## 2021-08-26 DIAGNOSIS — R29898 Other symptoms and signs involving the musculoskeletal system: Secondary | ICD-10-CM

## 2021-08-26 DIAGNOSIS — G8929 Other chronic pain: Secondary | ICD-10-CM

## 2021-08-26 DIAGNOSIS — M25561 Pain in right knee: Secondary | ICD-10-CM | POA: Diagnosis not present

## 2021-08-26 DIAGNOSIS — M170 Bilateral primary osteoarthritis of knee: Secondary | ICD-10-CM

## 2021-08-26 NOTE — Therapy (Signed)
[facility] [address] [place], [place], [address] Phone: [phone]   Fax:  [phone]  Physical Therapy Treatment  Patient Details  Name: Tammie Grimes MRN: [id number] Date of Birth: 1976/05/25 Referring Provider (PT): Rosette Reveal, MD   Encounter Date: 08/26/2021   PT End of Session - 08/26/21 0806     Visit Number 14    Number of Visits 21    Date for PT Re-Evaluation 09/03/21    Authorization - Visit Number 4    Authorization - Number of Visits 10    Progress Note Due on Visit 10    PT Start Time 0804    PT Stop Time 0851    PT Time Calculation (min) 47 min    Activity Tolerance Patient tolerated treatment well    Behavior During Therapy [facility] for tasks assessed/performed             Past Medical History:  Diagnosis Date   Allergy     History reviewed. No pertinent surgical history.  There were no vitals filed for this visit.   Subjective Assessment - 08/26/21 0805     Subjective Pt. states she had an inactive weekend and slept a lot.  No new complaints.    Pertinent History PMH with h/o depression, BMI: 50-59.9, Injection 04/16/2021, and plantar fasciitis. Pt would like to return to [employer] for independent exercise soon.    Limitations Walking;Lifting;Standing    How long can you sit comfortably? WNL    How long can you stand comfortably? 10 mins    How long can you walk comfortably? 1 hour    Diagnostic tests x-rays dated 03/31/2021 reveal mild-moderate tricompartmental degenerative changes    Patient Stated Goals Need to be able to squat and lift for work. Able to climb stairs. Longer bouts of standing on feet.    Currently in Pain? No/denies    Pain Onset More than a month ago               There.ex:    Nustep L5 B LE only for 12 minutes.   Resisted gait 2BTB: 5x all 4-planes (no UE assist/ good upright posture).    Tandem gait with Airex (forward/ backward)- 3x (light to no  UE assist).    Sled push/pull 45 feet x 2 (50#)- no issues.     Prostretch: 3 x 30 sec. Each.  Pain limited in gastroc reported.    Star ex. (Cone touches) L/R       Manual tx.:     Supine hamstring/ gastroc/ piriformis 3x20 sec. Each.    Sidelying rectus femoris stretch. 3x20secs each.   Supine R lower leg LAD 5x.  Supine R proximal tibia AP grade II-III mobs. (As tolerated).            PT Short Term Goals - 06/05/21 1335       PT SHORT TERM GOAL #1   Title Pt reports appropirate return to public gym at least 2x a week without increase in abnormal pain or soreness in bilat LE.    Baseline Pt is currently not activity engage in gym based exercises 2/2 to fear other additional bilat knee pain flare up.    Time 4    Period Weeks    Status Achieved    Target Date 06/05/21               PT Long Term Goals - 07/23/21 1321  PT LONG TERM GOAL #1   Title Pt will obtained a FOTO score of 70 to measure self-reported improvement during functional ADLs    Baseline 52. 1/17: 55 (pt. reports limited compliance with HEP and starting to be more active in pool recently).    Time 6    Period Weeks    Status Not Met    Target Date 09/03/21      PT LONG TERM GOAL #2   Title Pt will increase strength of by at least 1/2 MMT grade in order to demonstrate improvement in strength and function.    Baseline MMT(R/L): 4/4 Hip flexion. 4/4 Hip ER. 5/5 Hip IR. 5/5 Hip abduction. 5/5 Hip adduction. 4/4 Knee extension.  4/4 Knee flexion * pain with L knee flexion*. 5/5 Ankle dorsiflexion.  1/17: hip flexion 4/5 MMT, abduction 5/5 MMT, knee extension 4+/5 MMT (no pain),    Time 6    Period Weeks    Status Partially Met    Target Date 09/03/21      PT LONG TERM GOAL #3   Title Pt will be independent with HEP in order to decrease worse bilat knee pain by atleast 4/10 NPS in order to improve pain-free function at home and work.    Baseline Worse pain 10/10    Time 8    Period Weeks     Status Not Met    Target Date 09/03/21      PT LONG TERM GOAL #4   Title Pt will be able to perfrom a box lift of atleast 30# with proper form and pain less than 2/10 10 times.    Baseline Pt was able to lift 17 # box with proper form with verbal cueing.  1/17: 20# floor to waist lift with good technique but pain.    Time 8    Period Weeks    Status Partially Met    Target Date 09/03/21      PT LONG TERM GOAL #5   Title Pt will be able to complete at least 12 stairs without knee pain and proper form to allow stair amb for work related tasks    Baseline 4 steps x3 without rails. Slight increase in pain in the R knee compared to the L. Slight toe out of L foot compared to R.  1/17: Recip. gait on stairs with use of B handrails and increase discomfort with discomfort.    Time 8    Period Weeks    Status Partially Met    Target Date 09/03/21                Plan - 08/26/21 0849     Clinical Impression Statement Pt. continues to progress with functional and dynamic exercises while having no knee pain during session. Pt. will focus on becoming more independent with HEP and her ability to maintain her strength and control her pain. Pt. benefits from skilled PT services to progress stretching and joint mobs. to decrease her pain and increase her ROM. Pt. is continuing to have an active lifestyle outside of PT as she continues to do an aquatic gym program in tandem with PT 2-3 days per week.    Personal Factors and Comorbidities Comorbidity 3+;Past/Current Experience;Profession    Examination-Activity Limitations Stairs;Stand;Transfers;Locomotion Level;Squat;Lift    Examination-Participation Restrictions Occupation    Stability/Clinical Decision Making Evolving/Moderate complexity    Clinical Decision Making Moderate    Rehab Potential Good    PT Frequency 2x / week  PT Duration 6 weeks    PT Treatment/Interventions Aquatic Therapy;Biofeedback;Electrical Stimulation;Traction;Moist  Heat;Ultrasound;Gait training;Stair training;Functional mobility training;Therapeutic activities;Therapeutic exercise;Balance training;Neuromuscular re-education;Passive range of motion;Dry needling;Energy conservation;Joint Manipulations;Spinal Manipulations    PT Next Visit Plan R LE strengthening.    PT Home Exercise Plan [id number]    Consulted and Agree with Plan of Care Patient             Patient will benefit from skilled therapeutic intervention in order to improve the following deficits and impairments:  Abnormal gait, Decreased balance, Decreased activity tolerance, Decreased endurance, Decreased coordination, Decreased mobility, Decreased range of motion, Hypomobility, Decreased strength, Impaired flexibility, Improper body mechanics, Pain, Obesity, Postural dysfunction  Visit Diagnosis: Chronic pain of right knee  Impaired strength of lower extremity  Chronic pain of left knee  Bilateral primary osteoarthritis of knee     Problem List Patient Active Problem List   Diagnosis Date Noted   Class 3 severe obesity with serious comorbidity and body mass index (BMI) of 45.0 to 49.9 in adult ([place]) 06/27/2021   Flu vaccine need 05/20/2021   Primary osteoarthritis of right knee 04/10/2021   Primary osteoarthritis of left knee 04/10/2021   BMI 50.0-59.9, adult ([place]) 04/10/2021   Plantar fasciitis 10/23/2020   Allergic rhinitis 05/24/2019   Allergy 05/24/2019   Depo-Provera contraceptive status 05/24/2019   Depression 05/24/2019   Pura Spice, PT, DPT # [id number] Cleopatra Cedar, SPT 08/26/2021, 9:01 AM  [facility] [address]. [place], [place], [address] Phone: [phone]   Fax:  [phone]  Name: JOBY HERSHKOWITZ MRN: [id number] Date of Birth: [birth year]

## 2021-08-28 ENCOUNTER — Encounter: Payer: PRIVATE HEALTH INSURANCE | Admitting: Physical Therapy

## 2021-09-02 ENCOUNTER — Encounter: Payer: Self-pay | Admitting: Family Medicine

## 2021-09-02 DIAGNOSIS — Z6841 Body Mass Index (BMI) 40.0 and over, adult: Secondary | ICD-10-CM

## 2021-09-03 ENCOUNTER — Ambulatory Visit: Payer: PRIVATE HEALTH INSURANCE | Admitting: Physical Therapy

## 2021-09-03 MED ORDER — SEMAGLUTIDE-WEIGHT MANAGEMENT 0.25 MG/0.5ML ~~LOC~~ SOAJ: 0.2500 mg | SUBCUTANEOUS | 0 refills | Status: DC

## 2021-09-06 ENCOUNTER — Other Ambulatory Visit: Payer: Self-pay

## 2021-09-06 DIAGNOSIS — Z6841 Body Mass Index (BMI) 40.0 and over, adult: Secondary | ICD-10-CM

## 2021-09-06 MED ORDER — WEGOVY 0.25 MG/0.5ML ~~LOC~~ SOAJ: 0.2500 mg | SUBCUTANEOUS | 0 refills | Status: DC

## 2021-09-24 ENCOUNTER — Telehealth: Payer: Self-pay

## 2021-09-24 NOTE — Telephone Encounter (Signed)
PA completed form faxed.  ? ?Waiting for insurance approval. ? ?KP ?

## 2021-09-25 NOTE — Telephone Encounter (Signed)
Called pt could not leave VM. Called to let pt know Tammie Grimes was denied by insurance. Pt can call her insurance and try to appeal the decision. ? ?PEC nurse may give results to patient if they return call to clinic, a CRM has been created. ? ?KP ?

## 2021-09-25 NOTE — Telephone Encounter (Signed)
Denied  KP 

## 2021-10-08 NOTE — Telephone Encounter (Signed)
Pt pharmacy_  [facility] called in re to PA want it faxed to them at [phone] ID [id number] ?

## 2021-10-08 NOTE — Telephone Encounter (Signed)
PA was completed and denied by insurance. Medication is not covered under patients plan. ? ?KP ?

## 2021-10-29 ENCOUNTER — Encounter: Payer: Self-pay | Admitting: Family Medicine

## 2021-10-29 ENCOUNTER — Ambulatory Visit (INDEPENDENT_AMBULATORY_CARE_PROVIDER_SITE_OTHER): Payer: PRIVATE HEALTH INSURANCE | Admitting: Family Medicine

## 2021-10-29 ENCOUNTER — Inpatient Hospital Stay (INDEPENDENT_AMBULATORY_CARE_PROVIDER_SITE_OTHER): Payer: PRIVATE HEALTH INSURANCE | Admitting: Radiology

## 2021-10-29 VITALS — BP 128/78 | HR 78 | Ht 65.0 in | Wt 308.0 lb

## 2021-10-29 DIAGNOSIS — Z6841 Body Mass Index (BMI) 40.0 and over, adult: Secondary | ICD-10-CM | POA: Diagnosis not present

## 2021-10-29 DIAGNOSIS — M76812 Anterior tibial syndrome, left leg: Secondary | ICD-10-CM | POA: Insufficient documentation

## 2021-10-29 DIAGNOSIS — M1712 Unilateral primary osteoarthritis, left knee: Secondary | ICD-10-CM | POA: Diagnosis not present

## 2021-10-29 MED ORDER — PHENTERMINE HCL 15 MG PO CAPS: ORAL_CAPSULE | ORAL | 0 refills | Status: DC

## 2021-10-29 MED ORDER — TRIAMCINOLONE ACETONIDE 40 MG/ML IJ SUSP
40.0000 mg | Freq: Once | INTRAMUSCULAR | Status: AC
  Administered 2021-10-29: 40 mg via INTRAMUSCULAR

## 2021-10-29 MED ORDER — TOPIRAMATE 25 MG PO TABS: ORAL_TABLET | ORAL | 0 refills | Status: DC

## 2021-10-29 NOTE — Assessment & Plan Note (Signed)
Secondary/compensatory anterior ankle pain in the setting of ipsilateral left knee osteoarthritis related arthralgia.  As her knee has been symptomatic over the past 2-3 weeks, she has noted her ankle to have begun to be symptomatic as well.  Pain with simple walking, nonradiating, examination reassuring, moderate discomfort with repetitive flexion/extension with full body weight on 1 extremity.  I anticipate as left knee improves, this area should resolve.  She was advised to contact her office for any lingering symptoms at the left ankle at the 2-week mark, or beyond. ?

## 2021-10-29 NOTE — Patient Instructions (Addendum)
You have just been given a cortisone injection to reduce pain and inflammation. After the injection you may notice immediate relief of pain as a result of the Lidocaine. It is important to rest the area of the injection for 24 to 48 hours after the injection. There is a possibility of some temporary increased discomfort and swelling for up to 72 hours until the cortisone begins to work. If you do have pain, simply rest the joint and use ice. If you can tolerate over the counter medications, you can try Tylenol, Aleve, or Advil for added relief per package instructions. ? ?- Start phentermine and topiramate ?- Increase doses after 2 weeks ?- Referral coordinator will contact you regarding setting of nutritionist visits ?- Return for follow-up in 6 weeks ?

## 2021-10-29 NOTE — Assessment & Plan Note (Signed)
Patient has demonstrated interval weight gain, previously noted excellent response from [facility] and [employer] samples, unfortunately insurance coverage was an issue.  We discussed additional treatment strategies both surgical and nonsurgical.  She is waiting to proceed with bariatric surgery, considering this a "last resort".  In interim she is amenable to a trial of phentermine and topiramate, these new prescriptions were sent to her pharmacy with plans for titration at 2 weeks and formal reevaluation in 6 weeks.  Additionally, referral to nutritionist has been placed to optimize her weight loss regimen. ? ?Chronic condition, symptomatic, Rx management ?

## 2021-10-29 NOTE — Assessment & Plan Note (Signed)
Chronic condition that has been symptomatic for the past 2-3 weeks, atraumatic recurrence of symptoms.  Of note she received viscosupplementation on 06/27/2021 and has been essentially symptom controlled until recently.  Examination shows generalized tenderness.  Treatment strategies were reviewed and she did elect to proceed with ultrasound-guided corticosteroid injection to the left knee.  Once 6 months from last viscosupplementation has elapsed (end of June/early July 2023 timeframe), she would benefit from seeking repeat authorization for viscosupplementation. ?

## 2021-10-29 NOTE — Progress Notes (Signed)
?  ? ?Primary Care / Sports Medicine Office Visit ? ?Patient Information:  ?Patient ID: Tammie Grimes, female DOB: March 18, 1976 Age: 46 y.o. MRN: [id number]  ? ?Tammie Grimes is a pleasant 46 y.o. female presenting with the following: ? ?Chief Complaint  ?Patient presents with  ? Knee Pain  ?  Osteoarthritis left  ? Foot Pain  ?  Left foot, knot on bottom, pain on top   ? weight loss  ?  Try [place] PA again. Less energy, more weight gain, more knee pain  ? ? ?Vitals:  ? 10/29/21 1114  ?BP: 128/78  ?Pulse: 78  ?SpO2: 98%  ? ?Vitals:  ? 10/29/21 1114  ?Weight: (!) 308 lb (139.7 kg)  ?Height: 5\' 5"  (1.651 m)  ? ?Body mass index is 51.25 kg/m?. ? ?No results found.  ? ?Independent interpretation of notes and tests performed by another provider:  ? ?None ? ?Procedures performed:  ? ?Procedure:  Injection of left knee under ultrasound guidance. ?Ultrasound guidance utilized for out of plane anterolateral intra-articular cortisone injection, no effusion noted ?Samsung HS60 device utilized with permanent recording / reporting. ?Verbal informed consent obtained and verified. ?Skin prepped in a sterile fashion. ?Ethyl chloride for topical local analgesia.  ?Completed without difficulty and tolerated well. ?Medication: triamcinolone acetonide 40 mg/mL suspension for injection 1 mL total and 2 mL lidocaine 1% without epinephrine utilized for needle placement anesthetic ?Advised to contact for fevers/chills, erythema, induration, drainage, or persistent bleeding. ? ? ?Pertinent History, Exam, Impression, and Recommendations:  ? ?Problem List Items Addressed This Visit   ? ?  ? Musculoskeletal and Integument  ? Primary osteoarthritis of left knee - Primary  ?  Chronic condition that has been symptomatic for the past 2-3 weeks, atraumatic recurrence of symptoms.  Of note she received viscosupplementation on 06/27/2021 and has been essentially symptom controlled until recently.  Examination shows generalized tenderness.   Treatment strategies were reviewed and she did elect to proceed with ultrasound-guided corticosteroid injection to the left knee.  Once 6 months from last viscosupplementation has elapsed (end of June/early July 2023 timeframe), she would benefit from seeking repeat authorization for viscosupplementation. ? ?  ?  ? Relevant Orders  ? [place] LIMITED JOINT SPACE STRUCTURES LOW LEFT  ? Referral to Nutrition and Diabetes Services  ? Anterior tibialis tendinitis, left  ?  Secondary/compensatory anterior ankle pain in the setting of ipsilateral left knee osteoarthritis related arthralgia.  As her knee has been symptomatic over the past 2-3 weeks, she has noted her ankle to have begun to be symptomatic as well.  Pain with simple walking, nonradiating, examination reassuring, moderate discomfort with repetitive flexion/extension with full body weight on 1 extremity.  I anticipate as left knee improves, this area should resolve.  She was advised to contact her office for any lingering symptoms at the left ankle at the 2-week mark, or beyond. ? ?  ?  ?  ? Other  ? Class 3 severe obesity with serious comorbidity and body mass index (BMI) of 45.0 to 49.9 in adult [facility])  ?  Patient has demonstrated interval weight gain, previously noted excellent response from [facility] and [employer] samples, unfortunately insurance coverage was an issue.  We discussed additional treatment strategies both surgical and nonsurgical.  She is waiting to proceed with bariatric surgery, considering this a "last resort".  In interim she is amenable to a trial of phentermine and topiramate, these new prescriptions were sent to her pharmacy with plans for titration at  2 weeks and formal reevaluation in 6 weeks.  Additionally, referral to nutritionist has been placed to optimize her weight loss regimen. ? ?Chronic condition, symptomatic, Rx management ? ?  ?  ? Relevant Medications  ? topiramate (TOPAMAX) 25 MG tablet  ? phentermine 15 MG capsule  ?  ? ?Orders &  Medications ?Meds ordered this encounter  ?Medications  ? topiramate (TOPAMAX) 25 MG tablet  ?  Sig: Take 1 tablet (25 mg total) by mouth daily for 14 days, THEN 2 tablets (50 mg total) daily.  ?  Dispense:  74 tablet  ?  Refill:  0  ? phentermine 15 MG capsule  ?  Sig: Take 1 capsule (15 mg total) by mouth every morning for 14 days, THEN 2 capsules (30 mg total) every morning.  ?  Dispense:  74 capsule  ?  Refill:  0  ? triamcinolone acetonide (KENALOG-40) injection 40 mg  ? ?Orders Placed This Encounter  ?Procedures  ? [place] LIMITED JOINT SPACE STRUCTURES LOW LEFT  ? HM PAP SMEAR  ? Referral to Nutrition and Diabetes Services  ?  ? ?Return in about 6 weeks (around 12/10/2021).  ?  ? ?Tammie Culver, MD ? ? Primary Care Sports Medicine ?[facility] ?[place]  ? ?

## 2021-12-10 ENCOUNTER — Ambulatory Visit: Payer: PRIVATE HEALTH INSURANCE | Admitting: Family Medicine

## 2022-01-03 ENCOUNTER — Encounter: Payer: Self-pay | Admitting: Family Medicine

## 2022-01-03 ENCOUNTER — Ambulatory Visit (INDEPENDENT_AMBULATORY_CARE_PROVIDER_SITE_OTHER): Payer: PRIVATE HEALTH INSURANCE | Admitting: Family Medicine

## 2022-01-03 VITALS — BP 118/78 | HR 70 | Ht 65.0 in | Wt 298.6 lb

## 2022-01-03 DIAGNOSIS — M1712 Unilateral primary osteoarthritis, left knee: Secondary | ICD-10-CM

## 2022-01-03 DIAGNOSIS — Z6841 Body Mass Index (BMI) 40.0 and over, adult: Secondary | ICD-10-CM | POA: Diagnosis not present

## 2022-01-03 DIAGNOSIS — E66813 Obesity, class 3: Secondary | ICD-10-CM

## 2022-01-03 DIAGNOSIS — M1711 Unilateral primary osteoarthritis, right knee: Secondary | ICD-10-CM | POA: Diagnosis not present

## 2022-01-03 MED ORDER — PHENTERMINE HCL 30 MG PO CAPS: 30.0000 mg | ORAL_CAPSULE | ORAL | 0 refills | Status: DC

## 2022-01-03 MED ORDER — TOPIRAMATE 50 MG PO TABS: 50.0000 mg | ORAL_TABLET | Freq: Every day | ORAL | 0 refills | Status: DC

## 2022-01-03 NOTE — Progress Notes (Signed)
     Primary Care / Sports Medicine Office Visit  Patient Information:  Patient ID: Tammie Grimes, female DOB: Aug 10, 1975 Age: 46 y.o. MRN: [id number]   Tammie Grimes is a pleasant 46 y.o. female presenting with the following:  Chief Complaint  Patient presents with   Weight Check    Vitals:   01/03/22 1127  BP: 118/78  Pulse: 70  SpO2: 98%   Vitals:   01/03/22 1127  Weight: 298 lb 9.6 oz (135.4 kg)  Height: 5\' 5"  (1.651 m)   Body mass index is 49.69 kg/m.  No results found.   Independent interpretation of notes and tests performed by another provider:   None  Procedures performed:   None  Pertinent History, Exam, Impression, and Recommendations:   Problem List Items Addressed This Visit       Musculoskeletal and Integument   Primary osteoarthritis of right knee - Primary    Bilateral knee symptoms have been stable with no significant change despite 10 pound weight loss.  Being said, she is only requiring medications sporadically for knee pain, will continue to track this issue.      Primary osteoarthritis of left knee    See additional assessment(s) for plan details.        Other   Class 3 severe obesity with serious comorbidity and body mass index (BMI) of 45.0 to 49.9 in adult [facility])    Patient demonstrated 10 pounds of weight loss with phentermine and topiramate combo, no adverse effects reported.  Cardiac and GI findings on examination today benign.  Plan for continued dosing as an adjunct to lifestyle changes, will recheck patient in 3 months.      Relevant Medications   phentermine 30 MG capsule   topiramate (TOPAMAX) 50 MG tablet     Orders & Medications Meds ordered this encounter  Medications   phentermine 30 MG capsule    Sig: Take 1 capsule (30 mg total) by mouth every morning.    Dispense:  90 capsule    Refill:  0   topiramate (TOPAMAX) 50 MG tablet    Sig: Take 1 tablet (50 mg total) by mouth daily.    Dispense:  90 tablet     Refill:  0   No orders of the defined types were placed in this encounter.    Return in about 3 months (around 04/05/2022).     04/07/2022, MD   Primary Care Sports Medicine [facility]

## 2022-01-03 NOTE — Assessment & Plan Note (Signed)
Patient demonstrated 10 pounds of weight loss with phentermine and topiramate combo, no adverse effects reported.  Cardiac and GI findings on examination today benign.  Plan for continued dosing as an adjunct to lifestyle changes, will recheck patient in 3 months.

## 2022-01-03 NOTE — Assessment & Plan Note (Signed)
Bilateral knee symptoms have been stable with no significant change despite 10 pound weight loss.  Being said, she is only requiring medications sporadically for knee pain, will continue to track this issue.

## 2022-01-03 NOTE — Assessment & Plan Note (Signed)
See additional assessment(s) for plan details. 

## 2022-01-09 ENCOUNTER — Ambulatory Visit: Payer: PRIVATE HEALTH INSURANCE | Admitting: Dietician

## 2022-02-11 ENCOUNTER — Encounter: Payer: Self-pay | Admitting: Family Medicine

## 2022-02-18 ENCOUNTER — Encounter: Payer: Self-pay | Admitting: Family Medicine

## 2022-02-18 ENCOUNTER — Ambulatory Visit (INDEPENDENT_AMBULATORY_CARE_PROVIDER_SITE_OTHER): Payer: PRIVATE HEALTH INSURANCE | Admitting: Family Medicine

## 2022-02-18 ENCOUNTER — Other Ambulatory Visit: Payer: Self-pay

## 2022-02-18 ENCOUNTER — Other Ambulatory Visit: Payer: Self-pay | Admitting: Family Medicine

## 2022-02-18 VITALS — BP 128/88 | HR 78 | Ht 65.0 in | Wt 304.6 lb

## 2022-02-18 DIAGNOSIS — F418 Other specified anxiety disorders: Secondary | ICD-10-CM | POA: Diagnosis not present

## 2022-02-18 DIAGNOSIS — Z6841 Body Mass Index (BMI) 40.0 and over, adult: Secondary | ICD-10-CM

## 2022-02-18 DIAGNOSIS — L719 Rosacea, unspecified: Secondary | ICD-10-CM | POA: Diagnosis not present

## 2022-02-18 DIAGNOSIS — M1711 Unilateral primary osteoarthritis, right knee: Secondary | ICD-10-CM

## 2022-02-18 DIAGNOSIS — M1712 Unilateral primary osteoarthritis, left knee: Secondary | ICD-10-CM

## 2022-02-18 MED ORDER — NALTREXONE-BUPROPION HCL ER 8-90 MG PO TB12: ORAL_TABLET | ORAL | 0 refills | Status: DC

## 2022-02-18 MED ORDER — DULOXETINE HCL 60 MG PO CPEP: 60.0000 mg | ORAL_CAPSULE | Freq: Every day | ORAL | 0 refills | Status: DC

## 2022-02-18 MED ORDER — HYDROXYZINE PAMOATE 25 MG PO CAPS: 25.0000 mg | ORAL_CAPSULE | Freq: Three times a day (TID) | ORAL | 1 refills | Status: DC | PRN

## 2022-02-18 NOTE — Telephone Encounter (Signed)
Called pharmacy, there is no way for get naltrexone 8mg . Please advise

## 2022-02-18 NOTE — Telephone Encounter (Signed)
Please advise 

## 2022-02-19 ENCOUNTER — Encounter: Payer: Self-pay | Admitting: Dietician

## 2022-02-19 NOTE — Telephone Encounter (Signed)
Pts response.  KP

## 2022-02-19 NOTE — Assessment & Plan Note (Signed)
Recent anxiety attack in the setting of comorbid ER visit over hernia for which she has upcoming surgical evaluation. Does have a history of depression, on Cymbalta for dual treatment (mood and msk symptoms), plan for PRN hydroxyzine trial.

## 2022-02-19 NOTE — Assessment & Plan Note (Signed)
Had been doing well with phentermine topiramate combo, will hold from refill given upcoming surgical evaluation. Can trial Contrave, pending insurance coverage can trial restart phentermine if no contraindications, alternatively compound pharmacy for [facility] to be considered.

## 2022-02-19 NOTE — Progress Notes (Signed)
Have not heard back from patient to reschedule her missed appointment from 01/09/22. Sent notification to referring provider. 

## 2022-02-19 NOTE — Progress Notes (Signed)
     Primary Care / Sports Medicine Office Visit  Patient Information:  Patient ID: Tammie Grimes, female DOB: 1976/07/04 Age: 46 y.o. MRN: [id number]   Tammie Grimes is a pleasant 46 y.o. female presenting with the following:  Chief Complaint  Patient presents with   Medication Refill    Would like to restart the diet medications, been off for over 1 week.     Vitals:   02/18/22 1000  BP: 128/88  Pulse: 78  SpO2: 98%   Vitals:   02/18/22 1000  Weight: (!) 304 lb 9.6 oz (138.2 kg)  Height: 5\' 5"  (1.651 m)   Body mass index is 50.69 kg/m.  No results found.   Independent interpretation of notes and tests performed by another provider:   None  Procedures performed:   None  Pertinent History, Exam, Impression, and Recommendations:   Problem List Items Addressed This Visit       Other   Depression with anxiety    Recent anxiety attack in the setting of comorbid ER visit over hernia for which she has upcoming surgical evaluation. Does have a history of depression, on Cymbalta for dual treatment (mood and msk symptoms), plan for PRN hydroxyzine trial.      Relevant Medications   hydrOXYzine (VISTARIL) 25 MG capsule   Class 3 severe obesity with serious comorbidity and body mass index (BMI) of 45.0 to 49.9 in adult [facility]) - Primary    Had been doing well with phentermine topiramate combo, will hold from refill given upcoming surgical evaluation. Can trial Contrave, pending insurance coverage can trial restart phentermine if no contraindications, alternatively compound pharmacy for [facility] to be considered.      Relevant Medications   Naltrexone-buPROPion HCl ER 8-90 MG TB12   Other Visit Diagnoses     Rosacea       Relevant Orders   Ambulatory referral to Dermatology        Orders & Medications Meds ordered this encounter  Medications   Naltrexone-buPROPion HCl ER 8-90 MG TB12    Sig: 1 tab daily for week 1, then 1 tab BID for week 2, then 2 tab  PO qAM and 1 tab PO qPM for week 3, then 2 tabs BID; increases can happen weekly as tolerated    Dispense:  120 tablet    Refill:  0   hydrOXYzine (VISTARIL) 25 MG capsule    Sig: Take 1-2 capsules (25-50 mg total) by mouth every 8 (eight) hours as needed for anxiety.    Dispense:  30 capsule    Refill:  1   Orders Placed This Encounter  Procedures   Ambulatory referral to Dermatology     Return if symptoms worsen or fail to improve.     [facility], MD   Primary Care Sports Medicine [facility]

## 2022-02-19 NOTE — Patient Instructions (Addendum)
-   If coverage issues with Contrave, contact our office - Use hydroxyzine as-needed for anxiety episodes - We will coordinate follow-up after hearing back from you

## 2022-03-28 ENCOUNTER — Other Ambulatory Visit: Payer: Self-pay | Admitting: Family Medicine

## 2022-03-28 NOTE — Telephone Encounter (Signed)
Requested medication (s) are due for refill today - provider review   Requested medication (s) are on the active medication list -yes  Future visit scheduled -no  Last refill: 02/18/22 #120  Notes to clinic: medication not assigned protocol- provider review   Requested Prescriptions  Pending Prescriptions Disp Refills   CONTRAVE 8-90 MG TB12 [Pharmacy Med Name: CONTRAVE ER 8-90 MG TABLET] 120 tablet 0    Sig: Take 1 tablet by mouth daily for 7 days then 1 twice a day for 7 days, then 2 in the am and 1 in the pm for 7 days then take 2 twice daily thereafter.     Off-Protocol Failed - 03/28/2022  9:33 AM      Failed - Medication not assigned to a protocol, review manually.      Passed - Valid encounter within last 12 months    Recent Outpatient Visits           1 month ago Class 3 severe obesity due to excess calories with serious comorbidity and body mass index (BMI) of 45.0 to 49.9 in adult [facility])   [place] [facility] at [place], Earley Abide, MD   2 months ago Primary osteoarthritis of right knee   [place] [facility] at [place], Earley Abide, MD   5 months ago Primary osteoarthritis of left knee   [place] [facility] at [facility], Earley Abide, MD   9 months ago Primary osteoarthritis of right knee   [place] [facility] at [facility], Earley Abide, MD   10 months ago Primary osteoarthritis of right knee   [place] [facility] at [facility], Earley Abide, MD                 Requested Prescriptions  Pending Prescriptions Disp Refills   CONTRAVE 8-90 MG TB12 [Pharmacy Med Name: CONTRAVE ER 8-90 MG TABLET] 120 tablet 0    Sig: Take 1 tablet by mouth daily for 7 days then 1 twice a day for 7 days, then 2 in the am and 1 in the pm for 7 days then take 2 twice daily thereafter.     Off-Protocol  Failed - 03/28/2022  9:33 AM      Failed - Medication not assigned to a protocol, review manually.      Passed - Valid encounter within last 12 months    Recent Outpatient Visits           1 month ago Class 3 severe obesity due to excess calories with serious comorbidity and body mass index (BMI) of 45.0 to 49.9 in adult [facility])   [place] [facility] at [place], Earley Abide, MD   2 months ago Primary osteoarthritis of right knee   [place] [facility] at [place], Earley Abide, MD   5 months ago Primary osteoarthritis of left knee   [facility] and [facility] at [facility], Earley Abide, MD   9 months ago Primary osteoarthritis of right knee   [facility] Care and Sports Medicine at [facility], Earley Abide, MD   10 months ago Primary osteoarthritis of right knee   [place] and Sports Medicine at [facility], Earley Abide, MD

## 2022-04-07 ENCOUNTER — Ambulatory Visit: Payer: PRIVATE HEALTH INSURANCE | Admitting: Family Medicine

## 2022-04-18 ENCOUNTER — Encounter: Payer: Self-pay | Admitting: Family Medicine

## 2022-04-21 ENCOUNTER — Ambulatory Visit (INDEPENDENT_AMBULATORY_CARE_PROVIDER_SITE_OTHER): Payer: PRIVATE HEALTH INSURANCE | Admitting: Family Medicine

## 2022-04-21 ENCOUNTER — Encounter: Payer: Self-pay | Admitting: Family Medicine

## 2022-04-21 VITALS — BP 126/86 | HR 88 | Ht 65.0 in | Wt 292.0 lb

## 2022-04-21 DIAGNOSIS — J019 Acute sinusitis, unspecified: Secondary | ICD-10-CM | POA: Diagnosis not present

## 2022-04-21 DIAGNOSIS — Z23 Encounter for immunization: Secondary | ICD-10-CM | POA: Diagnosis not present

## 2022-04-21 DIAGNOSIS — H8112 Benign paroxysmal vertigo, left ear: Secondary | ICD-10-CM | POA: Diagnosis not present

## 2022-04-21 DIAGNOSIS — Z6841 Body Mass Index (BMI) 40.0 and over, adult: Secondary | ICD-10-CM

## 2022-04-21 DIAGNOSIS — Z7689 Persons encountering health services in other specified circumstances: Secondary | ICD-10-CM

## 2022-04-21 DIAGNOSIS — B9689 Other specified bacterial agents as the cause of diseases classified elsewhere: Secondary | ICD-10-CM | POA: Insufficient documentation

## 2022-04-21 MED ORDER — AMOXICILLIN-POT CLAVULANATE 875-125 MG PO TABS: 1.0000 | ORAL_TABLET | Freq: Two times a day (BID) | ORAL | 0 refills | Status: DC

## 2022-04-21 MED ORDER — NALTREXONE-BUPROPION HCL ER 8-90 MG PO TB12: 2.0000 | ORAL_TABLET | Freq: Two times a day (BID) | ORAL | 2 refills | Status: DC

## 2022-04-21 NOTE — Progress Notes (Signed)
Primary Care / Sports Medicine Office Visit  Patient Information:  Patient ID: Tammie Grimes, female DOB: 04-26-1976 Age: 46 y.o. MRN: [id number]   Tammie Grimes is a pleasant 46 y.o. female presenting with the following:  Chief Complaint  Patient presents with   Ear Pain    Left, for a couple weeks, is making her feel dizzy at times.     Vitals:   04/21/22 1055  BP: 126/86  Pulse: 88  SpO2: 99%   Vitals:   04/21/22 1055  Weight: 292 lb (132.5 kg)  Height: 5\' 5"  (1.651 m)   Body mass index is 48.59 kg/m.  No results found.   Independent interpretation of notes and tests performed by another provider:   None  Procedures performed:   None  Pertinent History, Exam, Impression, and Recommendations:   Problem List Items Addressed This Visit       Respiratory   Acute bacterial sinusitis - Primary    3-week history of left ear fullness, pressure, vertigo, sporadic, more recent left facial pressure, denies any fevers, chills, severe congestion, throat pain, shortness of air.  Examination with benign tympanic membranes and canals bilaterally, mild erythematous turbinates, tenderness at the left maxillary sinus, oropharynx benign, clear lung fields throughout without wheezes, rales, rhonchi.  Plan for Augmentin course, Flonase, antihistamine, and supportive care.  Patient was advised to contact us if vertigo symptoms fail to resolve after aforementioned regimen.      Relevant Medications   amoxicillin-clavulanate (AUGMENTIN) 875-125 MG tablet     Nervous and Auditory   Vertigo, benign paroxysmal, left    Chronic history of the same, has been essentially asymptomatic into the past 3 weeks, does have positive findings with Dix-Hallpike maneuver.  In addition to comorbid sinusitis features, I have advised patient to monitor for any lack of resolution.  If persistently symptomatic, referral to PT and initiation of meclizine to be considered.        Other    Class 3 severe obesity with serious comorbidity and body mass index (BMI) of 45.0 to 49.9 in adult [facility])   Relevant Medications   Naltrexone-buPROPion HCl ER 8-90 MG TB12   Encounter for weight management    Tolerating Contrave well, excellent interim weight loss, no adverse effects reported save for increased tiredness.  Did discuss restart of previous prescribed phentermine as an adjunct, can start and monitor with low threshold to discontinue if adverse effects noted.      Relevant Medications   Naltrexone-buPROPion HCl ER 8-90 MG TB12   Other Visit Diagnoses     Need for immunization against influenza       Relevant Orders   Flu Vaccine QUAD 18mo+IM (Fluarix, Fluzone & Alfiuria Quad PF) (Completed)   Encounter for immunization       Relevant Orders   Pfizer Fall 2023 Covid-19 Vaccine 51yrs and older (Completed)        Orders & Medications Meds ordered this encounter  Medications   Naltrexone-buPROPion HCl ER 8-90 MG TB12    Sig: Take 2 tablets by mouth 2 (two) times daily.    Dispense:  120 tablet    Refill:  2   amoxicillin-clavulanate (AUGMENTIN) 875-125 MG tablet    Sig: Take 1 tablet by mouth 2 (two) times daily.    Dispense:  20 tablet    Refill:  0   Orders Placed This Encounter  Procedures   Flu Vaccine QUAD 13mo+IM (Fluarix, Fluzone & Alfiuria Quad PF)  [place] Covid-19 Vaccine 64yrs and older     Return in about 2 months (around 06/21/2022) for weight check and vertigo f/u.     [name], MD   Primary Care Sports Medicine [place]

## 2022-04-21 NOTE — Assessment & Plan Note (Signed)
Chronic history of the same, has been essentially asymptomatic into the past 3 weeks, does have positive findings with Dix-Hallpike maneuver.  In addition to comorbid sinusitis features, I have advised patient to monitor for any lack of resolution.  If persistently symptomatic, referral to PT and initiation of meclizine to be considered.

## 2022-04-21 NOTE — Patient Instructions (Addendum)
-   Take antibiotics for full course - Take Flonase and Claritin/ Zyrtec daily while on antibiotics then as-needed - Contact after if vertigo symptoms continue - Restart phentermine 30 mg daily (stop if any adverse effects noted and contact office) - Return in 2 months

## 2022-04-21 NOTE — Assessment & Plan Note (Addendum)
3-week history of left ear fullness, pressure, vertigo, sporadic, more recent left facial pressure, denies any fevers, chills, severe congestion, throat pain, shortness of air.  Examination with benign tympanic membranes and canals bilaterally, mild erythematous turbinates, tenderness at the left maxillary sinus, oropharynx benign, clear lung fields throughout without wheezes, rales, rhonchi.  Plan for Augmentin course, Flonase, antihistamine, and supportive care.  Patient was advised to contact us if vertigo symptoms fail to resolve after aforementioned regimen.

## 2022-04-21 NOTE — Assessment & Plan Note (Signed)
Tolerating Contrave well, excellent interim weight loss, no adverse effects reported save for increased tiredness.  Did discuss restart of previous prescribed phentermine as an adjunct, can start and monitor with low threshold to discontinue if adverse effects noted.

## 2022-05-13 ENCOUNTER — Other Ambulatory Visit: Payer: Self-pay | Admitting: Family Medicine

## 2022-05-13 DIAGNOSIS — Z1231 Encounter for screening mammogram for malignant neoplasm of breast: Secondary | ICD-10-CM

## 2022-05-16 ENCOUNTER — Ambulatory Visit: Payer: PRIVATE HEALTH INSURANCE | Admitting: Podiatry

## 2022-05-16 ENCOUNTER — Encounter: Payer: Self-pay | Admitting: Podiatry

## 2022-05-16 DIAGNOSIS — M722 Plantar fascial fibromatosis: Secondary | ICD-10-CM | POA: Diagnosis not present

## 2022-05-16 MED ORDER — DICLOFENAC SODIUM 75 MG PO TBEC: 75.0000 mg | DELAYED_RELEASE_TABLET | Freq: Two times a day (BID) | ORAL | 1 refills | Status: DC

## 2022-05-16 MED ORDER — BETAMETHASONE SOD PHOS & ACET 6 (3-3) MG/ML IJ SUSP
3.0000 mg | Freq: Once | INTRAMUSCULAR | Status: AC
  Administered 2022-05-16: 3 mg via INTRA_ARTICULAR

## 2022-05-16 MED ORDER — METHYLPREDNISOLONE 4 MG PO TBPK: ORAL_TABLET | ORAL | 0 refills | Status: DC

## 2022-05-16 NOTE — Progress Notes (Signed)
   Chief Complaint  Patient presents with   Foot Pain    "I'm hoping to get another injection for Plantar Fasciitis and hopefully a steroid prescription.  That's what they usually do when I have a flare-up."    Subjective: 46 y.o. female presenting today for follow up evaluation of plantar fasciitis of the bilateral feet.  Patient states that her right foot is feeling very well.  She does have some recurrent pain and tenderness to the left heel.  She says injections have worked very well for her in the past.  She presents for further treatment and evaluation   Past Medical History:  Diagnosis Date   Allergy      Objective: Physical Exam General: The patient is alert and oriented x3 in no acute distress.  Dermatology: Skin is warm, dry and supple bilateral lower extremities. Negative for open lesions or macerations bilateral.   Vascular: Dorsalis Pedis and Posterior Tibial pulses palpable bilateral.  Capillary fill time is immediate to all digits.  Neurological: Epicritic and protective threshold intact bilateral.   Musculoskeletal: Tenderness to palpation to the plantar aspect of the bilateral heels along the plantar fascia. All other joints range of motion within normal limits bilateral. Strength 5/5 in all groups bilateral.    Assessment: 1. plantar fasciitis bilateral feet  Plan of Care:  1. Patient evaluated.  2. Injection of 0.5cc Celestone soluspan injected into the right heel.  3.  Prescription for Medrol Dosepak 4.  Refill prescription for Diclofenac provided to patient. 5.  Continue custom molded orthotics 6.  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 Felecia Shelling, DPM    [address] [place], [place] [address]                Office [phone]  Fax [phone]

## 2022-05-28 ENCOUNTER — Ambulatory Visit
Admission: RE | Admit: 2022-05-28 | Discharge: 2022-05-28 | Disposition: A | Discharge status: Home / Self Care | Payer: PRIVATE HEALTH INSURANCE | Source: Ambulatory Visit | Attending: Family Medicine | Admitting: Family Medicine

## 2022-05-28 DIAGNOSIS — Z1231 Encounter for screening mammogram for malignant neoplasm of breast: Secondary | ICD-10-CM | POA: Insufficient documentation

## 2022-06-17 ENCOUNTER — Ambulatory Visit: Payer: PRIVATE HEALTH INSURANCE | Admitting: Podiatry

## 2022-06-27 ENCOUNTER — Telehealth: Payer: Self-pay | Admitting: Physician Assistant

## 2022-06-27 DIAGNOSIS — J069 Acute upper respiratory infection, unspecified: Secondary | ICD-10-CM

## 2022-06-27 MED ORDER — AZELASTINE HCL 0.1 % NA SOLN: 1.0000 | Freq: Two times a day (BID) | NASAL | 0 refills | Status: DC

## 2022-06-27 MED ORDER — BENZONATATE 100 MG PO CAPS: 100.0000 mg | ORAL_CAPSULE | Freq: Three times a day (TID) | ORAL | 0 refills | Status: DC | PRN

## 2022-06-27 NOTE — Progress Notes (Signed)

## 2022-07-02 ENCOUNTER — Other Ambulatory Visit: Payer: Self-pay

## 2022-07-02 DIAGNOSIS — M1712 Unilateral primary osteoarthritis, left knee: Secondary | ICD-10-CM

## 2022-07-02 DIAGNOSIS — M1711 Unilateral primary osteoarthritis, right knee: Secondary | ICD-10-CM

## 2022-07-02 MED ORDER — DULOXETINE HCL 60 MG PO CPEP: 60.0000 mg | ORAL_CAPSULE | Freq: Every day | ORAL | 0 refills | Status: DC

## 2022-07-07 ENCOUNTER — Encounter: Payer: Self-pay | Admitting: Family Medicine

## 2022-07-10 ENCOUNTER — Encounter: Payer: Self-pay | Admitting: Family Medicine

## 2022-07-10 ENCOUNTER — Ambulatory Visit (INDEPENDENT_AMBULATORY_CARE_PROVIDER_SITE_OTHER): Payer: PRIVATE HEALTH INSURANCE | Admitting: Family Medicine

## 2022-07-10 ENCOUNTER — Inpatient Hospital Stay (INDEPENDENT_AMBULATORY_CARE_PROVIDER_SITE_OTHER): Payer: PRIVATE HEALTH INSURANCE | Admitting: Radiology

## 2022-07-10 VITALS — BP 110/80 | HR 80 | Ht 65.0 in | Wt 305.0 lb

## 2022-07-10 DIAGNOSIS — Z1211 Encounter for screening for malignant neoplasm of colon: Secondary | ICD-10-CM | POA: Insufficient documentation

## 2022-07-10 DIAGNOSIS — M1712 Unilateral primary osteoarthritis, left knee: Secondary | ICD-10-CM | POA: Diagnosis not present

## 2022-07-10 DIAGNOSIS — Z7689 Persons encountering health services in other specified circumstances: Secondary | ICD-10-CM | POA: Diagnosis not present

## 2022-07-10 MED ORDER — MELOXICAM 15 MG PO TABS: 15.0000 mg | ORAL_TABLET | Freq: Every day | ORAL | 0 refills | Status: DC

## 2022-07-10 MED ORDER — METFORMIN HCL 500 MG PO TABS: 500.0000 mg | ORAL_TABLET | Freq: Two times a day (BID) | ORAL | 3 refills | Status: DC

## 2022-07-10 MED ORDER — TRIAMCINOLONE ACETONIDE 40 MG/ML IJ SUSP
40.0000 mg | Freq: Once | INTRAMUSCULAR | Status: AC
  Administered 2022-07-10: 40 mg via INTRAMUSCULAR

## 2022-07-10 MED ORDER — WEGOVY 0.25 MG/0.5ML ~~LOC~~ SOAJ: 0.2500 mg | SUBCUTANEOUS | 0 refills | Status: DC

## 2022-07-10 NOTE — Assessment & Plan Note (Signed)
Cologuard ordered

## 2022-07-10 NOTE — Progress Notes (Signed)
Primary Care / Sports Medicine Office Visit  Patient Information:  Patient ID: Tammie Grimes, female DOB: 11-25-1975 Age: 47 y.o. MRN: [id number]   Tammie Grimes is a pleasant 47 y.o. female presenting with the following:  Chief Complaint  Patient presents with   Primary osteoarthritis of left knee    1 week    Vitals:   07/10/22 1410  BP: 110/80  Pulse: 80  SpO2: 97%   Vitals:   07/10/22 1410  Weight: (!) 305 lb (138.3 kg)  Height: 5\' 5"  (1.651 m)   Body mass index is 50.75 kg/m.  No results found.   Independent interpretation of notes and tests performed by another provider:   Procedure: Aspiration and injection of left knee under ultrasound guidance. Ultrasound guidance utilized for in-plane superolateral approach, hypoechoic region consistent with effusion noted in the suprapatellar bursa, this was successfully aspirated with resolution of effusion noted, following this corticosteroid was injected Samsung HS60 device utilized with permanent recording / reporting. Verbal informed consent obtained and verified. Skin prepped in a sterile fashion. Ethyl chloride for topical local analgesia.  Completed without difficulty and tolerated well. Aspirate: 23 cc straw-colored inflammatory fluid Medication: triamcinolone acetonide 40 mg/mL suspension for injection 1 mL total and 2 mL lidocaine 1% without epinephrine utilized for needle placement anesthetic Advised to contact for fevers/chills, erythema, induration, drainage, or persistent bleeding.   Procedures performed:   None  Pertinent History, Exam, Impression, and Recommendations:   Tammie Grimes was seen today for primary osteoarthritis of left knee.  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  Noted anterolateral pain with swelling, difficulty with ADLs, took Tylenol and rest for pain control.  Examination with tenderness at the lateral patellar facet, 1-2+ effusion, range of motion 0-120 degrees limited by pain and swelling, nontender otherwise, no ligamentous laxity appreciated, negative McMurray's.  Given treatments to date, prior response to cortisone, patient did elect to proceed with ultrasound-guided aspiration followed by corticosteroid injection which she tolerated well.  Additionally wraparound hinged knee brace provided, meloxicam course advised.  Orders: -     [place] LIMITED JOINT SPACE STRUCTURES LOW LEFT; Future -     Meloxicam; Take 1 tablet (15 mg total) by mouth daily. X 2 weeks then daily as-needed for knee pain  Dispense: 30 tablet; Refill: 0 -     Triamcinolone Acetonide  Colon cancer screening Assessment & Plan: Cologuard ordered  Orders: -     Cologuard  Encounter for weight management Assessment & Plan: Patient reports new insurance coverage, we will attempt to send Wegovy in to ascertain coverage.  Additionally metformin regimen written for.  Will follow-up on this issue at her return for annual physical in 1-2 months.  Orders: -     Wegovy; Inject 0.25 mg into the skin once a week. Use this dose for 1 month (4 shots) and then increase to next higher dose.  Dispense: 2 mL; Refill: 0  Other orders -     metFORMIN HCl; Take 1 tablet (500 mg total) by mouth 2 (two) times daily with a meal.  Dispense: 180 tablet; Refill: 3     Orders & Medications Meds ordered this encounter  Medications   DISCONTD: WEGOVY 0.25 MG/0.5ML SOAJ    Sig: Inject 0.25 mg into the skin once a week. Use this dose for 1 month (4 shots) and then increase to next higher  dose.    Dispense:  2 mL    Refill:  0   DISCONTD: meloxicam (MOBIC) 15 MG tablet    Sig: Take 1 tablet (15 mg total) by mouth daily. X 2 weeks then daily as-needed for knee pain    Dispense:  30 tablet    Refill:  0   DISCONTD: metFORMIN (GLUCOPHAGE) 500 MG tablet    Sig: Take 1 tablet (500 mg total) by mouth 2 (two) times daily with a meal.    Dispense:  180 tablet    Refill:  3   meloxicam (MOBIC) 15 MG tablet    Sig: Take 1 tablet (15 mg total) by mouth daily. X 2 weeks then daily as-needed for knee pain    Dispense:  30 tablet    Refill:  0   metFORMIN (GLUCOPHAGE) 500 MG tablet    Sig: Take 1 tablet (500 mg total) by mouth 2 (two) times daily with a meal.    Dispense:  180 tablet    Refill:  3   WEGOVY 0.25 MG/0.5ML SOAJ    Sig: Inject 0.25 mg into the skin once a week. Use this dose for 1 month (4 shots) and then increase to next higher dose.    Dispense:  2 mL    Refill:  0   triamcinolone acetonide (KENALOG-40) injection 40 mg   Orders Placed This Encounter  Procedures   [place] LIMITED JOINT SPACE STRUCTURES LOW LEFT   Cologuard     Return in about 4 weeks (around 08/07/2022) for 1-2 months, CPE.     [name], MD, [facility]   Primary Care Sports Medicine Primary Care and Sports Medicine at [facility]

## 2022-07-10 NOTE — Assessment & Plan Note (Signed)
Patient reports new insurance coverage, we will attempt to send Wegovy in to ascertain coverage.  Additionally metformin regimen written for.  Will follow-up on this issue at her return for annual physical in 1-2 months.

## 2022-07-10 NOTE — Patient Instructions (Addendum)
You have just been given a cortisone injection to reduce pain and inflammation. After the injection you may notice immediate relief of pain as a result of the Lidocaine. It is important to rest the area of the injection for 24 to 48 hours after the injection. There is a possibility of some temporary increased discomfort and swelling for up to 72 hours until the cortisone begins to work. If you do have pain, simply rest the joint and use ice. If you can tolerate over the counter medications, you can try Tylenol, Aleve, or Advil for added relief per package instructions. - As above, relative rest x 2 days and gradual return to normal activity - Dose meloxicam daily with food x 2 weeks, after 2 weeks dose daily as needed for knee pain until symptoms respond to cortisone -Use knee brace while on your feet for the next several days then transition to as needed usage as discussed -Start metformin daily x 2 weeks then twice daily (take with food) - WGNFAO sent to pharmacy, contact us for any issues filling this medication - Can check with your insurance regarding coverage for additional weight loss medications such as tirzepatide (Zepbound) and/or liraglutide (Saxenda) - Return in 1-2 months for physical

## 2022-07-10 NOTE — Assessment & Plan Note (Signed)
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  Noted anterolateral pain with swelling, difficulty with ADLs, took Tylenol and rest for pain control.  Examination with tenderness at the lateral patellar facet, 1-2+ effusion, range of motion 0-120 degrees limited by pain and swelling, nontender otherwise, no ligamentous laxity appreciated, negative McMurray's.  Given treatments to date, prior response to cortisone, patient did elect to proceed with ultrasound-guided aspiration followed by corticosteroid injection which she tolerated well.  Additionally wraparound hinged knee brace provided, meloxicam course advised.

## 2022-07-15 ENCOUNTER — Telehealth: Payer: Self-pay

## 2022-07-15 NOTE — Telephone Encounter (Signed)
error 

## 2022-07-23 ENCOUNTER — Encounter: Payer: Self-pay | Admitting: Family Medicine

## 2022-07-23 ENCOUNTER — Other Ambulatory Visit: Payer: Self-pay

## 2022-07-23 MED ORDER — MONTELUKAST SODIUM 10 MG PO TABS: 10.0000 mg | ORAL_TABLET | Freq: Every day | ORAL | 1 refills | Status: DC

## 2022-08-04 ENCOUNTER — Other Ambulatory Visit: Payer: Self-pay | Admitting: Family Medicine

## 2022-08-04 DIAGNOSIS — M1712 Unilateral primary osteoarthritis, left knee: Secondary | ICD-10-CM

## 2022-08-05 ENCOUNTER — Encounter: Payer: Self-pay | Admitting: Family Medicine

## 2022-08-05 ENCOUNTER — Ambulatory Visit: Payer: PRIVATE HEALTH INSURANCE | Admitting: Family Medicine

## 2022-08-05 VITALS — BP 126/84 | HR 96 | Ht 65.0 in | Wt 308.0 lb

## 2022-08-05 DIAGNOSIS — B9689 Other specified bacterial agents as the cause of diseases classified elsewhere: Secondary | ICD-10-CM | POA: Diagnosis not present

## 2022-08-05 DIAGNOSIS — J019 Acute sinusitis, unspecified: Secondary | ICD-10-CM | POA: Diagnosis not present

## 2022-08-05 MED ORDER — AZITHROMYCIN 250 MG PO TABS: ORAL_TABLET | ORAL | 0 refills | Status: AC

## 2022-08-05 MED ORDER — PROMETHAZINE-DM 6.25-15 MG/5ML PO SYRP: 5.0000 mL | ORAL_SOLUTION | Freq: Four times a day (QID) | ORAL | 0 refills | Status: DC | PRN

## 2022-08-05 NOTE — Assessment & Plan Note (Signed)
10-day history of cough, congestion, gradual resolution with rapid recurrence and worsening with new onset facial pressure symmetrically, worse symptoms at night.  Has been dosing Robitussin with modest response.  Examination reveals mildly ill-appearing individual with intermittent cough, lung fields demonstrate equal air entry bilaterally without wheezes, rales, rhonchi, benign cardiac sounds, oropharynx, tympanic membranes, and canals benign, erythematous and swollen turbinates bilaterally and tenderness about the maxillary regions bilaterally, no significant lymphadenopathy noted.  Patient's clinical history and findings are most consistent with acute bacterial sinusitis, course of azithromycin prescribed, as needed promethazine-DM, OTC medications encouraged.  She can follow-up as needed for this issue

## 2022-08-05 NOTE — Patient Instructions (Addendum)
-  Use antibiotics for full course - Can use Rx cough syrup as-needed - Use Mucinex (guaifenesin) every 12 hours while on antibiotics then as needed - Switch antihistamine and intranasal steroid to alternate OTC medications - Return for physical March 2024

## 2022-08-05 NOTE — Progress Notes (Signed)
     Primary Care / Sports Medicine Office Visit  Patient Information:  Patient ID: Tammie Grimes, female DOB: 1976/06/09 Age: 47 y.o. MRN: [id number]   Tammie Grimes is a pleasant 47 y.o. female presenting with the following:  Chief Complaint  Patient presents with   URI    Fatigue, cough, productive think yellow    Vitals:   08/05/22 0901  BP: 126/84  Pulse: 96  SpO2: 98%   Vitals:   08/05/22 0901  Weight: (!) 308 lb (139.7 kg)  Height: 5\' 5"  (1.651 m)   Body mass index is 51.25 kg/m.     Independent interpretation of notes and tests performed by another provider:   None  Procedures performed:   None  Pertinent History, Exam, Impression, and Recommendations:   Tammie Grimes was seen today for uri.  Acute bacterial sinusitis Assessment & Plan: 10-day history of cough, congestion, gradual resolution with rapid recurrence and worsening with new onset facial pressure symmetrically, worse symptoms at night.  Has been dosing Robitussin with modest response.  Examination reveals mildly ill-appearing individual with intermittent cough, lung fields demonstrate equal air entry bilaterally without wheezes, rales, rhonchi, benign cardiac sounds, oropharynx, tympanic membranes, and canals benign, erythematous and swollen turbinates bilaterally and tenderness about the maxillary regions bilaterally, no significant lymphadenopathy noted.  Patient's clinical history and findings are most consistent with acute bacterial sinusitis, course of azithromycin prescribed, as needed promethazine-DM, OTC medications encouraged.  She can follow-up as needed for this issue  Orders: -     Azithromycin; Take 2 tablets on day 1, then 1 tablet daily on days 2 through 5  Dispense: 6 tablet; Refill: 0 -     Promethazine-DM; Take 5 mLs by mouth 4 (four) times daily as needed for cough.  Dispense: 118 mL; Refill: 0     Orders & Medications Meds ordered this encounter  Medications    azithromycin (ZITHROMAX) 250 MG tablet    Sig: Take 2 tablets on day 1, then 1 tablet daily on days 2 through 5    Dispense:  6 tablet    Refill:  0   promethazine-dextromethorphan (PROMETHAZINE-DM) 6.25-15 MG/5ML syrup    Sig: Take 5 mLs by mouth 4 (four) times daily as needed for cough.    Dispense:  118 mL    Refill:  0   No orders of the defined types were placed in this encounter.    No follow-ups on file.     [name], MD, [facility]   Primary Care Sports Medicine Primary Care and Sports Medicine at [facility]

## 2022-08-05 NOTE — Telephone Encounter (Signed)
Requested medication (s) are due for refill today:   Not sure  Requested medication (s) are on the active medication list:   Yes as being discontinued 08/05/2022  Future visit scheduled:   No   Seen today by Dr. Zigmund Daniel   Last ordered: Discontinued 08/05/2022 however notes instruct pt to continue treatment with Mobic.    Returned because wasn't sure whether to refill this or not.    Not been on it long enough for the labs to be due.      Requested Prescriptions  Pending Prescriptions Disp Refills   meloxicam (MOBIC) 15 MG tablet [Pharmacy Med Name: Meloxicam 15mg  Tablet] 30 tablet 0    Sig: Take 1 tablet by mouth daily for 2 weeks, then take 1 tablet by mouth daily as needed for knee pain.     Analgesics:  COX2 Inhibitors Failed - 08/04/2022  2:20 PM      Failed - Manual Review: Labs are only required if the patient has taken medication for more than 8 weeks.      Failed - HGB in normal range and within 360 days    Hemoglobin  Date Value Ref Range Status  05/22/2021 13.0 11.1 - 15.9 g/dL Final         Failed - Cr in normal range and within 360 days    Creatinine, Ser  Date Value Ref Range Status  05/22/2021 0.72 0.57 - 1.00 mg/dL Final         Failed - HCT in normal range and within 360 days    Hematocrit  Date Value Ref Range Status  05/22/2021 38.4 34.0 - 46.6 % Final         Failed - AST in normal range and within 360 days    AST  Date Value Ref Range Status  05/22/2021 23 0 - 40 IU/L Final         Failed - ALT in normal range and within 360 days    ALT  Date Value Ref Range Status  05/22/2021 25 0 - 32 IU/L Final         Failed - eGFR is 30 or above and within 360 days    eGFR  Date Value Ref Range Status  05/22/2021 104 >59 mL/min/1.73 Final         Passed - Patient is not pregnant      Passed - Valid encounter within last 12 months    Recent Outpatient Visits           Today Acute bacterial sinusitis   [place] [facility] at  [place], Earley Abide, MD   3 weeks ago Primary osteoarthritis of left knee   [facility] [place] [facility], Earley Abide, MD   3 months ago Acute bacterial sinusitis   [place] [facility] [place] [facility], Earley Abide, MD   5 months ago Class 3 severe obesity due to excess calories with serious comorbidity and body mass index (BMI) of 45.0 to 49.9 in adult [facility])   [facility] at [place], Earley Abide, MD   7 months ago Primary osteoarthritis of right knee   [place] at [place], Earley Abide, MD       Future Appointments             In 1 month [name],  MD [place] at [facility], [facility]

## 2022-09-07 LAB — COLOGUARD: COLOGUARD: POSITIVE — AB

## 2022-09-08 ENCOUNTER — Other Ambulatory Visit: Payer: Self-pay

## 2022-09-08 DIAGNOSIS — Z1211 Encounter for screening for malignant neoplasm of colon: Secondary | ICD-10-CM

## 2022-09-08 NOTE — Progress Notes (Signed)
Referral placed.

## 2022-09-10 ENCOUNTER — Telehealth: Payer: Self-pay

## 2022-09-10 ENCOUNTER — Other Ambulatory Visit: Payer: Self-pay

## 2022-09-10 DIAGNOSIS — Z1211 Encounter for screening for malignant neoplasm of colon: Secondary | ICD-10-CM

## 2022-09-10 MED ORDER — NA SULFATE-K SULFATE-MG SULF 17.5-3.13-1.6 GM/177ML PO SOLN: 1.0000 | Freq: Once | ORAL | 0 refills | Status: AC

## 2022-09-10 NOTE — Telephone Encounter (Signed)
Gastroenterology Pre-Procedure Review  Request Date: 09/24/22 Requesting Physician: Dr. Vicente Males  PATIENT REVIEW QUESTIONS: The patient responded to the following health history questions as indicated:    1. Are you having any GI issues? no 2. Do you have a personal history of Polyps? no 3. Do you have a family history of Colon Cancer or Polyps? yes (mother polyps) 4. Diabetes Mellitus? no 5. Joint replacements in the past 12 months?no 6. Major health problems in the past 3 months?no 7. Any artificial heart valves, MVP, or defibrillator?no  8. Patient takes Phentermine has been advised to stop it 1 day before procedure  MEDICATIONS & ALLERGIES:    Patient reports the following regarding taking any anticoagulation/antiplatelet therapy:   Plavix, Coumadin, Eliquis, Xarelto, Lovenox, Pradaxa, Brilinta, or Effient? no Aspirin? no  Patient confirms/reports the following medications:  Current Outpatient Medications  Medication Sig Dispense Refill   phentermine (ADIPEX-P) 37.5 MG tablet Take 37.5 mg by mouth daily before breakfast.     ACETAMINOPHEN PO Take 1,000 mg by mouth every 6 (six) hours as needed.     DULoxetine (CYMBALTA) 60 MG capsule Take 1 capsule (60 mg total) by mouth daily. 90 capsule 0   fluticasone (FLONASE) 50 MCG/ACT nasal spray Place 1 spray into both nostrils daily.     loratadine (CLARITIN) 10 MG tablet Take 10 mg by mouth daily.     montelukast (SINGULAIR) 10 MG tablet Take 1 tablet (10 mg total) by mouth at bedtime. 90 tablet 1   promethazine-dextromethorphan (PROMETHAZINE-DM) 6.25-15 MG/5ML syrup Take 5 mLs by mouth 4 (four) times daily as needed for cough. 118 mL 0   No current facility-administered medications for this visit.    Patient confirms/reports the following allergies:  Allergies  Allergen Reactions   Other Other (See Comments) and Itching    Dust mites     No orders of the defined types were placed in this encounter.   AUTHORIZATION  INFORMATION Primary Insurance: 1D#: Group #:  Secondary Insurance: 1D#: Group #:  SCHEDULE INFORMATION: Date: 09/24/22 Time: Location: [facility]

## 2022-09-12 ENCOUNTER — Encounter: Payer: Self-pay | Admitting: Family Medicine

## 2022-09-12 ENCOUNTER — Ambulatory Visit (INDEPENDENT_AMBULATORY_CARE_PROVIDER_SITE_OTHER): Payer: PRIVATE HEALTH INSURANCE | Admitting: Family Medicine

## 2022-09-12 VITALS — BP 126/80 | HR 100 | Ht 65.0 in | Wt 309.0 lb

## 2022-09-12 DIAGNOSIS — Z7689 Persons encountering health services in other specified circumstances: Secondary | ICD-10-CM | POA: Diagnosis not present

## 2022-09-12 DIAGNOSIS — Z13220 Encounter for screening for lipoid disorders: Secondary | ICD-10-CM

## 2022-09-12 DIAGNOSIS — E559 Vitamin D deficiency, unspecified: Secondary | ICD-10-CM

## 2022-09-12 DIAGNOSIS — M1711 Unilateral primary osteoarthritis, right knee: Secondary | ICD-10-CM

## 2022-09-12 DIAGNOSIS — F418 Other specified anxiety disorders: Secondary | ICD-10-CM

## 2022-09-12 DIAGNOSIS — Z1159 Encounter for screening for other viral diseases: Secondary | ICD-10-CM

## 2022-09-12 DIAGNOSIS — Z0000 Encounter for general adult medical examination without abnormal findings: Secondary | ICD-10-CM | POA: Insufficient documentation

## 2022-09-12 DIAGNOSIS — Z1211 Encounter for screening for malignant neoplasm of colon: Secondary | ICD-10-CM | POA: Insufficient documentation

## 2022-09-12 DIAGNOSIS — M1712 Unilateral primary osteoarthritis, left knee: Secondary | ICD-10-CM | POA: Diagnosis not present

## 2022-09-12 DIAGNOSIS — Z114 Encounter for screening for human immunodeficiency virus [HIV]: Secondary | ICD-10-CM

## 2022-09-12 MED ORDER — LIRAGLUTIDE 18 MG/3ML ~~LOC~~ SOPN: PEN_INJECTOR | SUBCUTANEOUS | 2 refills | Status: DC

## 2022-09-12 MED ORDER — DULOXETINE HCL 60 MG PO CPEP: 60.0000 mg | ORAL_CAPSULE | Freq: Every day | ORAL | 0 refills | Status: DC

## 2022-09-12 MED ORDER — MELOXICAM 15 MG PO TABS: 15.0000 mg | ORAL_TABLET | Freq: Every day | ORAL | 0 refills | Status: DC | PRN

## 2022-09-12 NOTE — Assessment & Plan Note (Signed)
Well-controlled on current regimen. ?

## 2022-09-12 NOTE — Assessment & Plan Note (Signed)
Annual examination completed, risk stratification labs ordered, anticipatory guidance provided.  We will follow labs once resulted. 

## 2022-09-12 NOTE — Progress Notes (Signed)
Annual Physical Exam Visit  Patient Information:  Patient ID: Tammie Grimes, female DOB: [birth year] Age: 47 y.o. MRN: [id number]   Subjective:   CC: Annual Physical Exam  HPI:  Tammie Grimes is here for their annual physical.  I reviewed the past medical history, family history, social history, surgical history, and allergies today and changes were made as necessary.  Please see the problem list section below for additional details.  Past Medical History: Past Medical History:  Diagnosis Date   Allergy    Past Surgical History: History reviewed. No pertinent surgical history. Family History: Family History  Problem Relation Age of Onset   Breast cancer Neg Hx    Allergies: Allergies  Allergen Reactions   Other Other (See Comments) and Itching    Dust mites    Health Maintenance: Health Maintenance  Topic Date Due   Hepatitis C Screening  Never done   COLONOSCOPY (Pts 45-49yr Insurance coverage will need to be confirmed)  Never done   PAP SMEAR-Modifier  10/24/2023   DTaP/Tdap/Td (2 - Td or Tdap) 07/05/2028   INFLUENZA VACCINE  Completed   HIV Screening  Completed   HPV VACCINES  Aged Out   COVID-19 Vaccine  Discontinued    HM Colonoscopy          Overdue - COLONOSCOPY (Pts 45-44yrInsurance coverage will need to be confirmed) (Every 10 Years) Overdue - never done    No completion history exists for this topic.           Medications: Current Outpatient Medications on File Prior to Visit  Medication Sig Dispense Refill   ACETAMINOPHEN PO Take 1,000 mg by mouth every 6 (six) hours as needed.     fluticasone (FLONASE) 50 MCG/ACT nasal spray Place 1 spray into both nostrils daily.     loratadine (CLARITIN) 10 MG tablet Take 10 mg by mouth daily.     montelukast (SINGULAIR) 10 MG tablet Take 1 tablet (10 mg total) by mouth at bedtime. 90 tablet 1   No current facility-administered medications on file prior to visit.    Review of Systems:  No headache, visual changes, nausea, vomiting, diarrhea, constipation, dizziness, abdominal pain, skin rash, fevers, chills, night sweats, swollen lymph nodes, weight loss, chest pain, body aches, joint swelling, muscle aches, shortness of breath, mood changes, visual or auditory hallucinations reported.  Objective:   Vitals:   09/12/22 1342  BP: 126/80  Pulse: 100  SpO2: 99%   Vitals:   09/12/22 1342  Weight: (!) 309 lb (140.2 kg)  Height: '5\' 5"'$  (1.651 m)   Body mass index is 51.42 kg/m.  General: Well Developed, well nourished, and in no acute distress.  Neuro: Alert and oriented x3, extra-ocular muscles intact, sensation grossly intact. Cranial nerves II through XII are grossly intact, motor, sensory, and coordinative functions are intact. HEENT: Normocephalic, atraumatic, pupils equal round reactive to light, neck supple, no masses, no lymphadenopathy, thyroid nonpalpable. Oropharynx, nasopharynx, external ear canals are unremarkable. Skin: Warm and dry, no rashes noted.  Cardiac: Regular rate and rhythm, no murmurs rubs or gallops. No peripheral edema. Pulses symmetric. Respiratory: Clear to auscultation bilaterally. Not using accessory muscles, speaking in full sentences.  Abdominal: Soft, nontender, nondistended, positive bowel sounds, no masses, no organomegaly. Musculoskeletal: Shoulder, elbow, wrist, hip, knee, ankle stable, and with full range of motion.  Female chaperone initials: AS present throughout the physical examination.  Impression and Recommendations:   The patient was counselled, risk factors  were discussed, and anticipatory guidance given.  Problem List Items Addressed This Visit       Musculoskeletal and Integument   Primary osteoarthritis of right knee   Relevant Medications   meloxicam (MOBIC) 15 MG tablet   DULoxetine (CYMBALTA) 60 MG capsule   Primary osteoarthritis of left knee    Chronic, well-controlled with sporadic dosing of meloxicam.   Medication refilled.      Relevant Medications   meloxicam (MOBIC) 15 MG tablet   DULoxetine (CYMBALTA) 60 MG capsule     Other   Encounter for weight management    Will attempt at obtaining coverage for liraglutide, titration regimen discussed with patient, will contact us if any Rx fill issues.  If encountered, will attempt alternate regimen with phentermine and topiramate.  Follow-up in 1 month for weight management.      Depression with anxiety    Well-controlled on current regimen.      Relevant Medications   DULoxetine (CYMBALTA) 60 MG capsule   Annual physical exam - Primary    Annual examination completed, risk stratification labs ordered, anticipatory guidance provided.  We will follow labs once resulted.      Relevant Orders   CBC   Comprehensive metabolic panel   Hepatitis C antibody   HIV Antibody (routine testing w rflx)   Lipid panel   TSH   VITAMIN D 25 Hydroxy (Vit-D Deficiency, Fractures)   Other Visit Diagnoses     Screening for HIV (human immunodeficiency virus)       Relevant Orders   HIV Antibody (routine testing w rflx)   Need for hepatitis C screening test       Relevant Orders   Hepatitis C antibody   Screening for lipoid disorders       Relevant Orders   Comprehensive metabolic panel   Lipid panel   Vitamin D deficiency       Relevant Orders   VITAMIN D 25 Hydroxy (Vit-D Deficiency, Fractures)        Orders & Medications Medications:  Meds ordered this encounter  Medications   meloxicam (MOBIC) 15 MG tablet    Sig: Take 1 tablet (15 mg total) by mouth daily as needed for pain.    Dispense:  90 tablet    Refill:  0   liraglutide (VICTOZA) 18 MG/3ML SOPN    Sig: Inject 0.6 mg into the skin daily. 0.6 mg once daily for 1 week; increase by 0.6 mg daily at weekly intervals to a target dose of 1.8 mg once daily    Dispense:  3 mL    Refill:  2   DULoxetine (CYMBALTA) 60 MG capsule    Sig: Take 1 capsule (60 mg total) by mouth daily.     Dispense:  90 capsule    Refill:  0   Orders Placed This Encounter  Procedures   CBC   Comprehensive metabolic panel   Hepatitis C antibody   HIV Antibody (routine testing w rflx)   Lipid panel   TSH   VITAMIN D 25 Hydroxy (Vit-D Deficiency, Fractures)     Return in about 4 weeks (around 10/10/2022) for Weight medication follow-up.    [name], MD, [facility]   Primary Care Sports Medicine [facility] at [facility]

## 2022-09-12 NOTE — Assessment & Plan Note (Signed)
Chronic, well-controlled with sporadic dosing of meloxicam.  Medication refilled.

## 2022-09-12 NOTE — Assessment & Plan Note (Signed)
Will attempt at obtaining coverage for liraglutide, titration regimen discussed with patient, will contact us if any Rx fill issues.  If encountered, will attempt alternate regimen with phentermine and topiramate.  Follow-up in 1 month for weight management.

## 2022-09-12 NOTE — Patient Instructions (Addendum)
-   Obtain fasting labs with orders provided (can have water or black coffee but otherwise no food or drink x 8 hours before labs) - Review information provided - Attend eye doctor annually, dentist every 6 months, work towards or maintain 30 minutes of moderate intensity physical activity at least 5 days per week, and consume a balanced diet - Return in 1 month for follow-up - Contact us for any questions between now and then  Additionally: - Start liraglutide, follow the dosing schedule below:  -If any issues obtaining medication, contact us for alternate regimen (phentermine and topiramate)

## 2022-09-13 ENCOUNTER — Encounter: Payer: Self-pay | Admitting: Family Medicine

## 2022-09-15 ENCOUNTER — Telehealth: Payer: Self-pay

## 2022-09-15 ENCOUNTER — Other Ambulatory Visit: Payer: Self-pay | Admitting: Family Medicine

## 2022-09-15 DIAGNOSIS — Z7689 Persons encountering health services in other specified circumstances: Secondary | ICD-10-CM

## 2022-09-15 MED ORDER — PHENTERMINE HCL 15 MG PO CAPS: 15.0000 mg | ORAL_CAPSULE | ORAL | 0 refills | Status: DC

## 2022-09-15 MED ORDER — TOPIRAMATE 25 MG PO CPSP: 25.0000 mg | ORAL_CAPSULE | Freq: Every day | ORAL | 0 refills | Status: DC

## 2022-09-15 NOTE — Telephone Encounter (Signed)
Please advise 

## 2022-09-15 NOTE — Telephone Encounter (Signed)
Error.     KP 

## 2022-09-17 ENCOUNTER — Encounter: Payer: Self-pay | Admitting: Family Medicine

## 2022-09-17 ENCOUNTER — Other Ambulatory Visit: Payer: Self-pay | Admitting: Family Medicine

## 2022-09-17 LAB — LIPID PANEL
Chol/HDL Ratio: 2.6 ratio (ref 0.0–4.4)
Cholesterol, Total: 160 mg/dL (ref 100–199)
HDL: 61 mg/dL (ref >39)
LDL Chol Calc (NIH): 86 mg/dL (ref 0–99)
Triglycerides: 68 mg/dL (ref 0–149)
VLDL Cholesterol Cal: 13 mg/dL (ref 5–40)

## 2022-09-17 LAB — COMPREHENSIVE METABOLIC PANEL
ALT: 52 IU/L — ABNORMAL HIGH (ref 0–32)
AST: 37 IU/L (ref 0–40)
Albumin/Globulin Ratio: 2 (ref 1.2–2.2)
Albumin: 4.3 g/dL (ref 3.9–4.9)
Alkaline Phosphatase: 112 IU/L (ref 44–121)
BUN/Creatinine Ratio: 14 (ref 9–23)
BUN: 10 mg/dL (ref 6–24)
Bilirubin Total: 0.4 mg/dL (ref 0.0–1.2)
CO2: 23 mmol/L (ref 20–29)
Calcium: 9.5 mg/dL (ref 8.7–10.2)
Chloride: 102 mmol/L (ref 96–106)
Creatinine, Ser: 0.71 mg/dL (ref 0.57–1.00)
Globulin, Total: 2.2 g/dL (ref 1.5–4.5)
Glucose: 86 mg/dL (ref 70–99)
Potassium: 4.9 mmol/L (ref 3.5–5.2)
Sodium: 139 mmol/L (ref 134–144)
Total Protein: 6.5 g/dL (ref 6.0–8.5)
eGFR: 105 mL/min/{1.73_m2} (ref >59)

## 2022-09-17 LAB — CBC
Hematocrit: 41.1 % (ref 34.0–46.6)
Hemoglobin: 13.8 g/dL (ref 11.1–15.9)
MCH: 32.5 pg (ref 26.6–33.0)
MCHC: 33.6 g/dL (ref 31.5–35.7)
MCV: 97 fL (ref 79–97)
Platelets: 245 10*3/uL (ref 150–450)
RBC: 4.25 x10E6/uL (ref 3.77–5.28)
RDW: 12.3 % (ref 11.7–15.4)
WBC: 5.2 10*3/uL (ref 3.4–10.8)

## 2022-09-17 LAB — VITAMIN D 25 HYDROXY (VIT D DEFICIENCY, FRACTURES): Vit D, 25-Hydroxy: 7.1 ng/mL — ABNORMAL LOW (ref 30.0–100.0)

## 2022-09-17 LAB — HEPATITIS C ANTIBODY: Hep C Virus Ab: NONREACTIVE

## 2022-09-17 LAB — TSH: TSH: 1.07 u[IU]/mL (ref 0.450–4.500)

## 2022-09-17 LAB — HIV ANTIBODY (ROUTINE TESTING W REFLEX): HIV Screen 4th Generation wRfx: NONREACTIVE

## 2022-09-17 MED ORDER — VITAMIN D (ERGOCALCIFEROL) 1.25 MG (50000 UNIT) PO CAPS: 50000.0000 [IU] | ORAL_CAPSULE | ORAL | 0 refills | Status: DC

## 2022-09-17 NOTE — Telephone Encounter (Signed)
Please advise 

## 2022-09-18 ENCOUNTER — Telehealth: Payer: Self-pay

## 2022-09-18 ENCOUNTER — Other Ambulatory Visit: Payer: Self-pay | Admitting: Family Medicine

## 2022-09-18 DIAGNOSIS — Z7689 Persons encountering health services in other specified circumstances: Secondary | ICD-10-CM

## 2022-09-18 MED ORDER — PHENTERMINE HCL 15 MG PO CAPS: 15.0000 mg | ORAL_CAPSULE | ORAL | 0 refills | Status: DC

## 2022-09-18 NOTE — Telephone Encounter (Signed)
Please review.  KP

## 2022-09-18 NOTE — Telephone Encounter (Signed)
Patient has been informed that she can take her Meloxicam as she normally does, but the morning of the procedure do not take any medications.  Reminded her to stop Phentermine 1 day prior to colonoscopy.  Thanks, [place], [place]

## 2022-09-24 ENCOUNTER — Ambulatory Visit: Payer: PRIVATE HEALTH INSURANCE | Admitting: Certified Registered"

## 2022-09-24 ENCOUNTER — Ambulatory Visit
Admission: RE | Admit: 2022-09-24 | Discharge: 2022-09-24 | Disposition: A | Discharge status: Home / Self Care | Payer: PRIVATE HEALTH INSURANCE | Attending: Gastroenterology | Admitting: Gastroenterology

## 2022-09-24 ENCOUNTER — Encounter: Payer: Self-pay | Admitting: Gastroenterology

## 2022-09-24 ENCOUNTER — Encounter
Admission: RE | Disposition: A | Discharge status: Home / Self Care | Payer: Self-pay | Source: Home / Self Care | Attending: Gastroenterology

## 2022-09-24 DIAGNOSIS — D126 Benign neoplasm of colon, unspecified: Secondary | ICD-10-CM | POA: Diagnosis not present

## 2022-09-24 DIAGNOSIS — D122 Benign neoplasm of ascending colon: Secondary | ICD-10-CM | POA: Insufficient documentation

## 2022-09-24 DIAGNOSIS — Z79899 Other long term (current) drug therapy: Secondary | ICD-10-CM | POA: Insufficient documentation

## 2022-09-24 DIAGNOSIS — K5730 Diverticulosis of large intestine without perforation or abscess without bleeding: Secondary | ICD-10-CM | POA: Insufficient documentation

## 2022-09-24 DIAGNOSIS — Z1211 Encounter for screening for malignant neoplasm of colon: Secondary | ICD-10-CM | POA: Insufficient documentation

## 2022-09-24 DIAGNOSIS — F32A Depression, unspecified: Secondary | ICD-10-CM | POA: Insufficient documentation

## 2022-09-24 DIAGNOSIS — Z6841 Body Mass Index (BMI) 40.0 and over, adult: Secondary | ICD-10-CM | POA: Diagnosis not present

## 2022-09-24 DIAGNOSIS — F419 Anxiety disorder, unspecified: Secondary | ICD-10-CM | POA: Diagnosis not present

## 2022-09-24 DIAGNOSIS — Z87891 Personal history of nicotine dependence: Secondary | ICD-10-CM | POA: Insufficient documentation

## 2022-09-24 HISTORY — PX: COLONOSCOPY WITH PROPOFOL: SHX5780

## 2022-09-24 LAB — POCT PREGNANCY, URINE: Preg Test, Ur: NEGATIVE

## 2022-09-24 SURGERY — COLONOSCOPY WITH PROPOFOL
Anesthesia: General

## 2022-09-24 MED ORDER — PROPOFOL 500 MG/50ML IV EMUL
INTRAVENOUS | Status: DC | PRN
  Administered 2022-09-24: 150 ug/kg/min via INTRAVENOUS
  Administered 2022-09-24: 30 mg via INTRAVENOUS
  Administered 2022-09-24: 50 mg via INTRAVENOUS

## 2022-09-24 MED ORDER — SODIUM CHLORIDE 0.9 % IV SOLN: INTRAVENOUS | Status: DC

## 2022-09-24 MED ORDER — DEXMEDETOMIDINE HCL IN NACL 80 MCG/20ML IV SOLN
INTRAVENOUS | Status: DC | PRN
  Administered 2022-09-24: 4 ug via BUCCAL

## 2022-09-24 MED ORDER — LIDOCAINE HCL (CARDIAC) PF 100 MG/5ML IV SOSY
PREFILLED_SYRINGE | INTRAVENOUS | Status: DC | PRN
  Administered 2022-09-24: 50 mg via INTRAVENOUS

## 2022-09-24 NOTE — H&P (Signed)
Jonathon Bellows, MD [address], [place], [place], [place], [address] [id number], [place], [place], [place], [address] Phone: [phone]  Fax: [phone]  Primary Care Physician:  [name], MD   Pre-Procedure History & Physical: HPI:  NADEZHDA SHIFLET is a 47 y.o. female is here for an colonoscopy.   Past Medical History:  Diagnosis Date   Allergy     History reviewed. No pertinent surgical history.  Prior to Admission medications   Medication Sig Start Date End Date Taking? Authorizing Provider  DULoxetine (CYMBALTA) 60 MG capsule Take 1 capsule (60 mg total) by mouth daily. 09/12/22  Yes [name], MD  liraglutide (VICTOZA) 18 MG/3ML SOPN Inject 0.6 mg into the skin daily. 0.6 mg once daily for 1 week; increase by 0.6 mg daily at weekly intervals to a target dose of 1.8 mg once daily 09/12/22  Yes [name], MD  loratadine (CLARITIN) 10 MG tablet Take 10 mg by mouth daily.   Yes [provider]  meloxicam (MOBIC) 15 MG tablet Take 1 tablet (15 mg total) by mouth daily as needed for pain. 09/12/22  Yes [name], MD  montelukast (SINGULAIR) 10 MG tablet Take 1 tablet (10 mg total) by mouth at bedtime. 07/23/22  Yes [name], MD  topiramate (TOPAMAX) 25 MG capsule Take 1 capsule (25 mg total) by mouth daily. 09/15/22   [name], MD  Vitamin D, Ergocalciferol, (DRISDOL) 1.25 MG (50000 UNIT) CAPS capsule Take 1 capsule (50,000 Units total) by mouth every 7 (seven) days. Take for 8 total doses(weeks) 09/17/22   [name], MD  ACETAMINOPHEN PO Take 1,000 mg by mouth every 6 (six) hours as needed.    [provider]  fluticasone (FLONASE) 50 MCG/ACT nasal spray Place 1 spray into both nostrils daily.    [provider]  phentermine 15 MG capsule Take 1 capsule (15 mg total) by mouth every morning. 09/18/22   [name], MD    Allergies as of 09/10/2022 - Review Complete 09/10/2022  Allergen  Reaction Noted   Other Other (See Comments) and Itching 06/20/2019    Family History  Problem Relation Age of Onset   Breast cancer Neg Hx     Social History   Socioeconomic History   Marital status: Married    Spouse name: Daliza Yasuda   Number of children: 1   Years of education: 12   Highest education level: High school graduate  Occupational History   Not on file  Tobacco Use   Smoking status: Former    Types: Cigarettes    Quit date: 2007    Years since quitting: 17.2   Smokeless tobacco: Never  Vaping Use   Vaping Use: Never used  Substance and Sexual Activity   Alcohol use: Yes    Alcohol/week: 14.0 standard drinks of alcohol    Types: 14 Standard drinks or equivalent per week    Comment: daily   Drug use: Never   Sexual activity: Not Currently    Partners: Male    Birth control/protection: Implant  Other Topics Concern   Not on file  Social History Narrative   Not on file   Social Determinants of Health   Financial Resource Strain: Not on file  Food Insecurity: No Food Insecurity (04/21/2022)   Hunger Vital Sign    Worried About Running Out of Food in the Last Year: Never true    Ran Out  of Food in the Last Year: Never true  Transportation Needs: No Transportation Needs (04/21/2022)   PRAPARE - Hydrologist (Medical): No    Lack of Transportation (Non-Medical): No  Physical Activity: Not on file  Stress: Not on file  Social Connections: Not on file  Intimate Partner Violence: Not At Risk (04/21/2022)   Humiliation, Afraid, Rape, and Kick questionnaire    Fear of Current or Ex-Partner: No    Emotionally Abused: No    Physically Abused: No    Sexually Abused: No    Review of Systems: See HPI, otherwise negative ROS  Physical Exam: There were no vitals taken for this visit. General:   Alert,  pleasant and cooperative in NAD Head:  Normocephalic and atraumatic. Neck:  Supple; no masses or thyromegaly. Lungs:   Clear throughout to auscultation, normal respiratory effort.    Heart:  +S1, +S2, Regular rate and rhythm, No edema. Abdomen:  Soft, nontender and nondistended. Normal bowel sounds, without guarding, and without rebound.   Neurologic:  Alert and  oriented x4;  grossly normal neurologically.  Impression/Plan: LANESIA COCKRILL is here for an colonoscopy to be performed for Screening colonoscopy average risk   Risks, benefits, limitations, and alternatives regarding  colonoscopy have been reviewed with the patient.  Questions have been answered.  All parties agreeable.   Jonathon Bellows, MD  09/24/2022, 12:36 PM

## 2022-09-24 NOTE — Op Note (Signed)
[facility] Gastroenterology Patient Name: Tammie Grimes Procedure Date: 09/24/2022 1:01 PM MRN: [id number] Account #: [id number] Date of Birth: 11-24-1975 Admit Type: Outpatient Age: 47 Room: [facility] ENDO ROOM 3 Gender: Female Note Status: Finalized Instrument Name: Jasper Riling [id number] Procedure:             Colonoscopy Indications:           Screening for colorectal malignant neoplasm Providers:             Jonathon Bellows MD, MD Medicines:             Monitored Anesthesia Care Complications:         No immediate complications. Procedure:             Pre-Anesthesia Assessment:                        - Prior to the procedure, a History and Physical was                         performed, and patient medications, allergies and                         sensitivities were reviewed. The patient's tolerance                         of previous anesthesia was reviewed.                        - The risks and benefits of the procedure and the                         sedation options and risks were discussed with the                         patient. All questions were answered and informed                         consent was obtained.                        - ASA Grade Assessment: II - A patient with mild                         systemic disease.                        After obtaining informed consent, the colonoscope was                         passed under direct vision. Throughout the procedure,                         the patient's blood pressure, pulse, and oxygen                         saturations were monitored continuously. The                         Colonoscope was introduced through the anus and  advanced to the the cecum, identified by the                         appendiceal orifice. The colonoscopy was performed                         with ease. The patient tolerated the procedure well.                         The quality of the bowel  preparation was excellent.                         The ileocecal valve, appendiceal orifice, and rectum                         were photographed. Findings:      The perianal and digital rectal examinations were normal.      Multiple medium-mouthed diverticula were found in the entire colon.      A 10 mm polyp was found in the proximal ascending colon. The polyp was       sessile. The polyp was removed with a cold snare. Resection and       retrieval were complete. To prevent bleeding after the polypectomy, one       hemostatic clip was successfully placed. There was no bleeding at the       end of the procedure.      The exam was otherwise without abnormality on direct and retroflexion       views. Impression:            - Diverticulosis in the entire examined colon.                        - One 10 mm polyp in the proximal ascending colon,                         removed with a cold snare. Resected and retrieved.                         Clip was placed.                        - The examination was otherwise normal on direct and                         retroflexion views. Recommendation:        - Discharge patient to home (with escort).                        - Resume previous diet.                        - Continue present medications.                        - Await pathology results.                        - Repeat colonoscopy for surveillance based on  pathology results. Procedure Code(s):     --- Professional ---                        [phone], Colonoscopy, flexible; with removal of                         tumor(s), polyp(s), or other lesion(s) by snare                         technique Diagnosis Code(s):     --- Professional ---                        Z12.11, Encounter for screening for malignant neoplasm                         of colon                        D12.2, Benign neoplasm of ascending colon                        K57.30, Diverticulosis of large  intestine without                         perforation or abscess without bleeding CPT copyright 2022 American Medical Association. All rights reserved. The codes documented in this report are preliminary and upon coder review may  be revised to meet current compliance requirements. Jonathon Bellows, MD Jonathon Bellows MD, MD 09/24/2022 1:23:31 PM This report has been signed electronically. Number of Addenda: 0 Note Initiated On: 09/24/2022 1:01 PM Scope Withdrawal Time: 0 hours 14 minutes 14 seconds  Total Procedure Duration: 0 hours 16 minutes 6 seconds  Estimated Blood Loss:  Estimated blood loss: none.      [facility]

## 2022-09-24 NOTE — Transfer of Care (Signed)
Immediate Anesthesia Transfer of Care Note  Patient: Tammie Grimes  Procedure(s) Performed: COLONOSCOPY WITH PROPOFOL  Patient Location: PACU  Anesthesia Type:General  Level of Consciousness: drowsy and responds to stimulation  Airway & Oxygen Therapy: Patient Spontanous Breathing  Post-op Assessment: Report given to RN and Post -op Vital signs reviewed and stable  Post vital signs: stable  Last Vitals:  Vitals Value Taken Time  BP 105/59 09/24/22 1325  Temp 36.8 C 09/24/22 1324  Pulse 96 09/24/22 1325  Resp 16 09/24/22 1325  SpO2 95 % 09/24/22 1325  Vitals shown include unvalidated device data.  Last Pain:  Vitals:   09/24/22 1324  TempSrc: Temporal         Complications: There were no known notable events for this encounter.

## 2022-09-24 NOTE — Anesthesia Preprocedure Evaluation (Signed)
Anesthesia Evaluation  Patient identified by MRN, date of birth, ID band Patient awake    Reviewed: Allergy & Precautions, H&P , NPO status , Patient's Chart, lab work & pertinent test results, reviewed documented beta blocker date and time   History of Anesthesia Complications Negative for: history of anesthetic complications  Airway Mallampati: I  TM Distance: >3 FB Neck ROM: full    Dental  (+) Caps, Dental Advidsory Given, Teeth Intact   Pulmonary neg pulmonary ROS, former smoker   Pulmonary exam normal breath sounds clear to auscultation       Cardiovascular Exercise Tolerance: Good negative cardio ROS Normal cardiovascular exam Rhythm:regular Rate:Normal     Neuro/Psych  PSYCHIATRIC DISORDERS Anxiety Depression    negative neurological ROS     GI/Hepatic negative GI ROS, Neg liver ROS,,,  Endo/Other  neg diabetes  Morbid obesity  Renal/GU negative Renal ROS  negative genitourinary   Musculoskeletal   Abdominal   Peds  Hematology negative hematology ROS (+)   Anesthesia Other Findings Past Medical History: No date: Allergy   Reproductive/Obstetrics negative OB ROS                             Anesthesia Physical Anesthesia Plan  ASA: 3  Anesthesia Plan: General   Post-op Pain Management:    Induction: Intravenous  PONV Risk Score and Plan: 3 and Propofol infusion and TIVA  Airway Management Planned: Natural Airway and Nasal Cannula  Additional Equipment:   Intra-op Plan:   Post-operative Plan:   Informed Consent: I have reviewed the patients History and Physical, chart, labs and discussed the procedure including the risks, benefits and alternatives for the proposed anesthesia with the patient or authorized representative who has indicated his/her understanding and acceptance.     Dental Advisory Given  Plan Discussed with: Anesthesiologist, CRNA and  Surgeon  Anesthesia Plan Comments:        Anesthesia Quick Evaluation

## 2022-09-25 ENCOUNTER — Encounter: Payer: Self-pay | Admitting: Gastroenterology

## 2022-09-25 LAB — SURGICAL PATHOLOGY

## 2022-09-25 NOTE — Anesthesia Postprocedure Evaluation (Signed)
Anesthesia Post Note  Patient: ALYSSON RESSLER  Procedure(s) Performed: COLONOSCOPY WITH PROPOFOL  Patient location during evaluation: Endoscopy Anesthesia Type: General Level of consciousness: awake and alert Pain management: pain level controlled Vital Signs Assessment: post-procedure vital signs reviewed and stable Respiratory status: spontaneous breathing, nonlabored ventilation, respiratory function stable and patient connected to nasal cannula oxygen Cardiovascular status: blood pressure returned to baseline and stable Postop Assessment: no apparent nausea or vomiting Anesthetic complications: no   There were no known notable events for this encounter.   Last Vitals:  Vitals:   09/24/22 1334 09/24/22 1344  BP: (!) 96/53 94/73  Pulse: 100   Resp:    Temp:    SpO2: 97%     Last Pain:  Vitals:   09/24/22 1324  TempSrc: Temporal                 Martha Clan

## 2022-10-09 ENCOUNTER — Other Ambulatory Visit: Payer: Self-pay | Admitting: Family Medicine

## 2022-10-09 DIAGNOSIS — Z7689 Persons encountering health services in other specified circumstances: Secondary | ICD-10-CM

## 2022-10-09 NOTE — Telephone Encounter (Signed)
Requested medication (s) are due for refill today: Yes  Requested medication (s) are on the active medication list: Yes  Last refill:  09/15/22  Future visit scheduled: Yes  Notes to clinic:  See request.    Requested Prescriptions  Pending Prescriptions Disp Refills   topiramate (TOPAMAX) 25 MG capsule [Pharmacy Med Name: Topiramate Sprinkle 25mg  Capsule] 30 capsule 0    Sig: Take 1 capsule by mouth daily.     Neurology: Anticonvulsants - topiramate & zonisamide Failed - 10/09/2022  1:10 PM      Failed - ALT in normal range and within 360 days    ALT  Date Value Ref Range Status  09/16/2022 52 (H) 0 - 32 IU/L Final         Passed - Cr in normal range and within 360 days    Creatinine, Ser  Date Value Ref Range Status  09/16/2022 0.71 0.57 - 1.00 mg/dL Final         Passed - CO2 in normal range and within 360 days    CO2  Date Value Ref Range Status  09/16/2022 23 20 - 29 mmol/L Final         Passed - AST in normal range and within 360 days    AST  Date Value Ref Range Status  09/16/2022 37 0 - 40 IU/L Final         Passed - Completed PHQ-2 or PHQ-9 in the last 360 days      Passed - Valid encounter within last 12 months    Recent Outpatient Visits           3 weeks ago Annual physical exam   [place] [facility] at [place], Earley Abide, MD   2 months ago Acute bacterial sinusitis   [place] [facility] at [place], Earley Abide, MD   3 months ago Primary osteoarthritis of left knee   [facility] [place] at [facility], Earley Abide, MD   5 months ago Acute bacterial sinusitis   [place] [facility] at [facility], Earley Abide, MD   7 months ago Class 3 severe obesity due to excess calories with serious comorbidity and body mass index (BMI) of 45.0 to 49.9 in adult [facility])   [facility] Primary Care & Sports Medicine at [facility], Earley Abide, MD       Future Appointments             In 1 week Zigmund Daniel, Earley Abide, MD [place] at [facility], [facility]

## 2022-10-16 ENCOUNTER — Ambulatory Visit: Payer: PRIVATE HEALTH INSURANCE | Admitting: Family Medicine

## 2022-10-16 ENCOUNTER — Other Ambulatory Visit: Payer: Self-pay | Admitting: Family Medicine

## 2022-10-16 ENCOUNTER — Encounter: Payer: Self-pay | Admitting: Family Medicine

## 2022-10-16 DIAGNOSIS — Z7689 Persons encountering health services in other specified circumstances: Secondary | ICD-10-CM

## 2022-10-16 MED ORDER — PHENTERMINE HCL 30 MG PO CAPS: 30.0000 mg | ORAL_CAPSULE | ORAL | 0 refills | Status: DC

## 2022-10-17 NOTE — Telephone Encounter (Signed)
Unable to refill per protocol, Rx request is too soon. Last refill 07/23/22 for 90 and 1 refill.  Requested Prescriptions  Pending Prescriptions Disp Refills   montelukast (SINGULAIR) 10 MG tablet [Pharmacy Med Name: Montelukast Sodium 10mg  Tablet] 90 tablet 0    Sig: Take 1 tablet by mouth at bedtime.     Pulmonology:  Leukotriene Inhibitors Passed - 10/16/2022 10:57 PM      Passed - Valid encounter within last 12 months    Recent Outpatient Visits           Yesterday Encounter for weight management   [place] [facility] at [facility], Ocie Bob, MD   1 month ago Annual physical exam   [facility] at [facility], Ocie Bob, MD   2 months ago Acute bacterial sinusitis   [facility] at [facility], Ocie Bob, MD   3 months ago Primary osteoarthritis of left knee   [facility] Primary Care & Sports Medicine at [facility], Ocie Bob, MD   5 months ago Acute bacterial sinusitis   [facility] at [facility], Ocie Bob, MD

## 2022-10-26 NOTE — Assessment & Plan Note (Signed)
Tolerating phentermine 15 mg and topiramate 25 mg well, losing weight, no adverse effects reported.  - Titrate phentermine to 30 mg - Keep current topiramate dose - Return as scheduled, okay to refill a maximum of 2 times before needing follow-up, will need to return if wanting further dose increase

## 2022-10-26 NOTE — Progress Notes (Signed)
     Primary Care / Sports Medicine Office Visit  Patient Information:  Patient ID: GRACLYN LAWTHER, female DOB: [birth year] Age: 47 y.o. MRN: [id number]   ZALAYAH PIZZUTO is a pleasant 47 y.o. female presenting with the following:  Chief Complaint  Patient presents with   Weight Check    Vitals:   10/16/22 1539  BP: 128/78  Pulse: 100  SpO2: 98%   Vitals:   10/16/22 1539  Weight: (!) 301 lb (136.5 kg)  Height:  (1.651 m)   Body mass index is 50.09 kg/m.  No results found.   Independent interpretation of notes and tests performed by another provider:   None  Procedures performed:   None  Pertinent History, Exam, Impression, and Recommendations:   Tammie Grimes was seen today for weight check.  Encounter for weight management Assessment & Plan: Tolerating phentermine 15 mg and topiramate 25 mg well, losing weight, no adverse effects reported.  - Titrate phentermine to 30 mg - Keep current topiramate dose - Return as scheduled, okay to refill a maximum of 2 times before needing follow-up, will need to return if wanting further dose increase  Orders: -     Phentermine HCl; Take 1 capsule (30 mg total) by mouth every morning.  Dispense: 30 capsule; Refill: 0     Orders & Medications Meds ordered this encounter  Medications   phentermine 30 MG capsule    Sig: Take 1 capsule (30 mg total) by mouth every morning.    Dispense:  30 capsule    Refill:  0   No orders of the defined types were placed in this encounter.    No follow-ups on file.     Jerrol Banana, MD, [facility]   Primary Care Sports Medicine Primary Care and Sports Medicine at [facility]

## 2022-10-27 ENCOUNTER — Other Ambulatory Visit: Payer: Self-pay | Admitting: Family Medicine

## 2022-10-27 DIAGNOSIS — Z7689 Persons encountering health services in other specified circumstances: Secondary | ICD-10-CM

## 2022-10-27 MED ORDER — TOPIRAMATE 25 MG PO CPSP: 25.0000 mg | ORAL_CAPSULE | Freq: Every day | ORAL | 0 refills | Status: DC

## 2022-10-27 NOTE — Telephone Encounter (Signed)
Refill

## 2022-10-31 ENCOUNTER — Other Ambulatory Visit: Payer: Self-pay | Admitting: Family Medicine

## 2022-10-31 NOTE — Telephone Encounter (Signed)
Requested medication (s) are due for refill today: no  Requested medication (s) are on the active medication list: yes  Last refill:  09/17/22 #8/0  Future visit scheduled: no  Notes to clinic:  Unable to refill per protocol, cannot delegate.     Requested Prescriptions  Pending Prescriptions Disp Refills   Vitamin D, Ergocalciferol, (DRISDOL) 1.25 MG (50000 UNIT) CAPS capsule [Pharmacy Med Name: Vitamin D Ergocalciferol 50,000unit ( ) Capsule] 8 capsule 0    Sig: Take 1 capsule by mouth every 7 days for 8 total doses (weeks).     Endocrinology:  Vitamins - Vitamin D Supplementation 2 Failed - 10/31/2022  3:34 PM      Failed - Manual Review: Route requests for 50,000 IU strength to the provider      Failed - Vitamin D in normal range and within 360 days    Vit D, 25-Hydroxy  Date Value Ref Range Status  09/16/2022 7.1 (L) 30.0 - 100.0 ng/mL Final    Comment:    Vitamin D deficiency has been defined by the Institute of Medicine and an Endocrine Society practice guideline as a level of serum 25-OH vitamin D less than 20 ng/mL (1,2). The Endocrine Society went on to further define vitamin D insufficiency as a level between 21 and 29 ng/mL (2). 1. IOM (Institute of Medicine). 2010. Dietary reference    intakes for calcium and D. [place]: The    [employer]. 2. Holick MF, Binkley Churdan, Bischoff-Ferrari HA, et al.    Evaluation, treatment, and prevention of vitamin D    deficiency: an Endocrine Society clinical practice    guideline. JCEM. 2011 Jul; 96(7):1911-30.          Passed - Ca in normal range and within 360 days    Calcium  Date Value Ref Range Status  09/16/2022 9.5 8.7 - 10.2 mg/dL Final         Passed - Valid encounter within last 12 months    Recent Outpatient Visits           2 weeks ago Encounter for weight management   [place] [facility] at [facility], Ocie Bob, MD   1 month ago Annual physical  exam   [facility] Medicine at [facility], Ocie Bob, MD   2 months ago Acute bacterial sinusitis   [facility] Sports Medicine at [facility], Ocie Bob, MD   3 months ago Primary osteoarthritis of left knee   [facility] at [facility], Ocie Bob, MD   6 months ago Acute bacterial sinusitis   [facility] Primary Care & Sports Medicine at [facility], Ocie Bob, MD

## 2022-11-23 ENCOUNTER — Other Ambulatory Visit: Payer: Self-pay | Admitting: Family Medicine

## 2022-11-23 DIAGNOSIS — Z7689 Persons encountering health services in other specified circumstances: Secondary | ICD-10-CM

## 2022-11-24 ENCOUNTER — Other Ambulatory Visit: Payer: Self-pay | Admitting: Family Medicine

## 2022-11-24 DIAGNOSIS — Z7689 Persons encountering health services in other specified circumstances: Secondary | ICD-10-CM

## 2022-11-24 MED ORDER — PHENTERMINE HCL 30 MG PO CAPS: 30.0000 mg | ORAL_CAPSULE | ORAL | 0 refills | Status: DC

## 2022-11-24 NOTE — Telephone Encounter (Signed)
Please review.  KP

## 2022-11-26 ENCOUNTER — Other Ambulatory Visit: Payer: Self-pay | Admitting: Family Medicine

## 2022-11-26 DIAGNOSIS — M1712 Unilateral primary osteoarthritis, left knee: Secondary | ICD-10-CM

## 2022-11-26 DIAGNOSIS — M1711 Unilateral primary osteoarthritis, right knee: Secondary | ICD-10-CM

## 2022-11-26 NOTE — Telephone Encounter (Signed)
Requested Prescriptions  Pending Prescriptions Disp Refills   DULoxetine (CYMBALTA) 60 MG capsule [Pharmacy Med Name: Duloxetine 60mg  Delayed-Release Capsule] 90 capsule 0    Sig: Take 1 capsule by mouth daily.     Psychiatry: Antidepressants - SNRI - duloxetine Passed - 11/26/2022 12:05 PM      Passed - Cr in normal range and within 360 days    Creatinine, Ser  Date Value Ref Range Status  09/16/2022 0.71 0.57 - 1.00 mg/dL Final         Passed - eGFR is 30 or above and within 360 days    eGFR  Date Value Ref Range Status  09/16/2022 105 >59 mL/min/1.73 Final         Passed - Completed PHQ-2 or PHQ-9 in the last 360 days      Passed - Last BP in normal range    BP Readings from Last 1 Encounters:  10/16/22 128/78         Passed - Valid encounter within last 6 months    Recent Outpatient Visits           1 month ago Encounter for weight management   [place] [facility] at [facility], Ocie Bob, MD   2 months ago Annual physical exam   [facility] Primary Care [facility] Sports Medicine at [facility], Ocie Bob, MD   3 months ago Acute bacterial sinusitis   [facility] at [facility], Ocie Bob, MD   4 months ago Primary osteoarthritis of left knee   [facility] Primary Care & Sports Medicine at [facility], Ocie Bob, MD   7 months ago Acute bacterial sinusitis   [facility] Primary Care & Sports Medicine at [facility], Ocie Bob, MD

## 2022-11-28 ENCOUNTER — Other Ambulatory Visit: Payer: Self-pay | Admitting: Family Medicine

## 2022-11-28 DIAGNOSIS — Z7689 Persons encountering health services in other specified circumstances: Secondary | ICD-10-CM

## 2022-11-28 NOTE — Telephone Encounter (Signed)
Requested medication (s) are due for refill today: yes  Requested medication (s) are on the active medication list: yes  Last refill:  10/27/22 #30  Future visit scheduled: no  Notes to clinic:  Pt was due to f/u for lab work to recheck liver enzyme- unable to schedule no order in place   Requested Prescriptions  Pending Prescriptions Disp Refills   topiramate (TOPAMAX) 25 MG capsule [Pharmacy Med Name: Topiramate Sprinkle 25mg  Capsule] 30 capsule 0    Sig: Take 1 capsule by mouth daily.     Neurology: Anticonvulsants - topiramate & zonisamide Failed - 11/28/2022 12:42 PM      Failed - ALT in normal range and within 360 days    ALT  Date Value Ref Range Status  09/16/2022 52 (H) 0 - 32 IU/L Final         Passed - Cr in normal range and within 360 days    Creatinine, Ser  Date Value Ref Range Status  09/16/2022 0.71 0.57 - 1.00 mg/dL Final         Passed - CO2 in normal range and within 360 days    CO2  Date Value Ref Range Status  09/16/2022 23 20 - 29 mmol/L Final         Passed - AST in normal range and within 360 days    AST  Date Value Ref Range Status  09/16/2022 37 0 - 40 IU/L Final         Passed - Completed PHQ-2 or PHQ-9 in the last 360 days      Passed - Valid encounter within last 12 months    Recent Outpatient Visits           1 month ago Encounter for weight management   [place] [facility] at [facility], Ocie Bob, MD   2 months ago Annual physical exam   [facility] at [facility], Ocie Bob, MD   3 months ago Acute bacterial sinusitis   [facility] Care [facility] Sports Medicine at [facility], Ocie Bob, MD   4 months ago Primary osteoarthritis of left knee   [facility] Health Primary Care & Sports Medicine at [facility], Ocie Bob, MD   7 months ago Acute bacterial sinusitis   [facility] Primary Care & Sports Medicine at [facility],  Ocie Bob, MD

## 2022-12-11 ENCOUNTER — Other Ambulatory Visit: Payer: Self-pay | Admitting: Family Medicine

## 2022-12-11 DIAGNOSIS — Z7689 Persons encountering health services in other specified circumstances: Secondary | ICD-10-CM

## 2022-12-11 NOTE — Telephone Encounter (Signed)
Requested medications are due for refill today.  no  Requested medications are on the active medications list.  yes  Last refill. 11/24/2022 #30 0 rf  Future visit scheduled.   yes  Notes to clinic.  Refill not delegated.    Requested Prescriptions  Pending Prescriptions Disp Refills   phentermine 30 MG capsule [Pharmacy Med Name: Phentermine Hydrochloride 30mg  Capsule] 30 capsule     Sig: Take 1 capsule by mouth every morning.     Not Delegated - Neurology: Anticonvulsants - Controlled - phentermine hydrochloride Failed - 12/11/2022 12:16 PM      Failed - This refill cannot be delegated      Passed - eGFR in normal range and within 360 days    eGFR  Date Value Ref Range Status  09/16/2022 105 >59 mL/min/1.73 Final         Passed - Cr in normal range and within 360 days    Creatinine, Ser  Date Value Ref Range Status  09/16/2022 0.71 0.57 - 1.00 mg/dL Final         Passed - Last BP in normal range    BP Readings from Last 1 Encounters:  10/16/22 128/78         Passed - Valid encounter within last 6 months    Recent Outpatient Visits           1 month ago Encounter for weight management   [place] [facility] at [facility], Ocie Bob, MD   3 months ago Annual physical exam   [facility] Sports Medicine at [facility], Ocie Bob, MD   4 months ago Acute bacterial sinusitis   [place] [facility] at [facility], Ocie Bob, MD   5 months ago Primary osteoarthritis of left knee   [facility] at [facility], Ocie Bob, MD   7 months ago Acute bacterial sinusitis   [facility] Primary Care & Sports Medicine at [facility], Ocie Bob, MD              Passed - Weight completed in the last 3 months    Wt Readings from Last 1 Encounters:  10/16/22 (!) 301 lb (136.5 kg)

## 2022-12-12 NOTE — Telephone Encounter (Signed)
Please review.  KP

## 2022-12-12 NOTE — Telephone Encounter (Signed)
Please schedule pt appt for 1 month.  KP

## 2022-12-17 ENCOUNTER — Other Ambulatory Visit: Payer: Self-pay | Admitting: Family Medicine

## 2022-12-17 DIAGNOSIS — Z7689 Persons encountering health services in other specified circumstances: Secondary | ICD-10-CM

## 2022-12-18 NOTE — Telephone Encounter (Signed)
Requested Prescriptions  Pending Prescriptions Disp Refills   topiramate (TOPAMAX) 25 MG capsule [Pharmacy Med Name: Topiramate Sprinkle 25mg  Capsule] 90 capsule 0    Sig: Take 1 capsule by mouth daily.     Neurology: Anticonvulsants - topiramate & zonisamide Failed - 12/17/2022  4:12 PM      Failed - ALT in normal range and within 360 days    ALT  Date Value Ref Range Status  09/16/2022 52 (H) 0 - 32 IU/L Final         Passed - Cr in normal range and within 360 days    Creatinine, Ser  Date Value Ref Range Status  09/16/2022 0.71 0.57 - 1.00 mg/dL Final         Passed - CO2 in normal range and within 360 days    CO2  Date Value Ref Range Status  09/16/2022 23 20 - 29 mmol/L Final         Passed - AST in normal range and within 360 days    AST  Date Value Ref Range Status  09/16/2022 37 0 - 40 IU/L Final         Passed - Completed PHQ-2 or PHQ-9 in the last 360 days      Passed - Valid encounter within last 12 months    Recent Outpatient Visits           2 months ago Encounter for weight management    Primary [facility] Medicine at [facility], Ocie Bob, MD   3 months ago Annual physical exam   [facility] at [facility], Ocie Bob, MD   4 months ago Acute bacterial sinusitis   [facility] Primary [facility] at [facility], Ocie Bob, MD   5 months ago Primary osteoarthritis of left knee   [facility] Primary Care & Sports Medicine at [facility], Ocie Bob, MD   8 months ago Acute bacterial sinusitis   [facility] Care [facility] Sports Medicine at [facility], Ocie Bob, MD       Future Appointments             In 3 weeks Ashley Royalty, Ocie Bob, MD [facility] Health Primary Care & Sports Medicine at [facility]

## 2023-01-12 ENCOUNTER — Ambulatory Visit: Payer: PRIVATE HEALTH INSURANCE | Admitting: Family Medicine

## 2023-02-15 ENCOUNTER — Other Ambulatory Visit: Payer: Self-pay | Admitting: Family Medicine

## 2023-02-15 DIAGNOSIS — M1711 Unilateral primary osteoarthritis, right knee: Secondary | ICD-10-CM

## 2023-02-15 DIAGNOSIS — M1712 Unilateral primary osteoarthritis, left knee: Secondary | ICD-10-CM

## 2023-02-17 NOTE — Telephone Encounter (Signed)
Requested Prescriptions  Pending Prescriptions Disp Refills   DULoxetine (CYMBALTA) 60 MG capsule [Pharmacy Med Name: Duloxetine 60mg  Delayed-Release Capsule] 90 capsule 0    Sig: Take 1 capsule by mouth daily.     Psychiatry: Antidepressants - SNRI - duloxetine Passed - 02/15/2023 12:51 PM      Passed - Cr in normal range and within 360 days    Creatinine, Ser  Date Value Ref Range Status  09/16/2022 0.71 0.57 - 1.00 mg/dL Final         Passed - eGFR is 30 or above and within 360 days    eGFR  Date Value Ref Range Status  09/16/2022 105 >59 mL/min/1.73 Final         Passed - Completed PHQ-2 or PHQ-9 in the last 360 days      Passed - Last BP in normal range    BP Readings from Last 1 Encounters:  10/16/22 128/78         Passed - Valid encounter within last 6 months    Recent Outpatient Visits           4 months ago Encounter for weight management   [place] [facility] at [facility], Ocie Bob, MD   5 months ago Annual physical exam   [facility] Care [facility] Sports [facility] at [facility], Ocie Bob, MD   6 months ago Acute bacterial sinusitis   [facility] at [facility], Ocie Bob, MD   7 months ago Primary osteoarthritis of left knee   [facility] Care [facility] Sports Medicine at [facility], Ocie Bob, MD   10 months ago Acute bacterial sinusitis   [facility] Health Primary Care & Sports Medicine at [facility], Ocie Bob, MD

## 2023-02-25 ENCOUNTER — Encounter: Payer: Self-pay | Admitting: Family Medicine

## 2023-02-25 ENCOUNTER — Other Ambulatory Visit: Payer: Self-pay

## 2023-02-25 MED ORDER — MONTELUKAST SODIUM 10 MG PO TABS: 10.0000 mg | ORAL_TABLET | Freq: Every day | ORAL | 0 refills | Status: DC

## 2023-05-03 ENCOUNTER — Other Ambulatory Visit: Payer: Self-pay | Admitting: Family Medicine

## 2023-05-05 NOTE — Telephone Encounter (Signed)
Requested Prescriptions  Pending Prescriptions Disp Refills   montelukast (SINGULAIR) 10 MG tablet [Pharmacy Med Name: Montelukast Sodium 10mg  Tablet] 90 tablet 0    Sig: Take 1 tablet by mouth at bedtime.     Pulmonology:  Leukotriene Inhibitors Passed - 05/03/2023 12:57 PM      Passed - Valid encounter within last 12 months    Recent Outpatient Visits           6 months ago Encounter for weight management   [place] [facility] at [facility], Ocie Bob, MD   7 months ago Annual physical exam   [facility] Primary Care & Sports Medicine at [facility], Ocie Bob, MD   9 months ago Acute bacterial sinusitis   [facility] Primary Care & Sports Medicine at [facility], Ocie Bob, MD   9 months ago Primary osteoarthritis of left knee   [facility] Primary Care & Sports Medicine at [facility], Ocie Bob, MD   1 year ago Acute bacterial sinusitis   [facility] Health Primary Care & Sports [facility] at [facility], Ocie Bob, MD

## 2023-05-31 IMAGING — US US BREAST*L* LIMITED INC AXILLA
1 series · 11 of 11 positions shown · non-contrast
Comparison: Previous exam(s).

ACR Breast Density Category a: The breast tissue is almost entirely
fatty.

CLINICAL DATA: Patient returns after screening for evaluation of
possible bilateral breast masses.



[Series 1: us breast*left* limited inc axilla · 0.08mm/px · 11 of 11 slices shown]
[im 1/11]
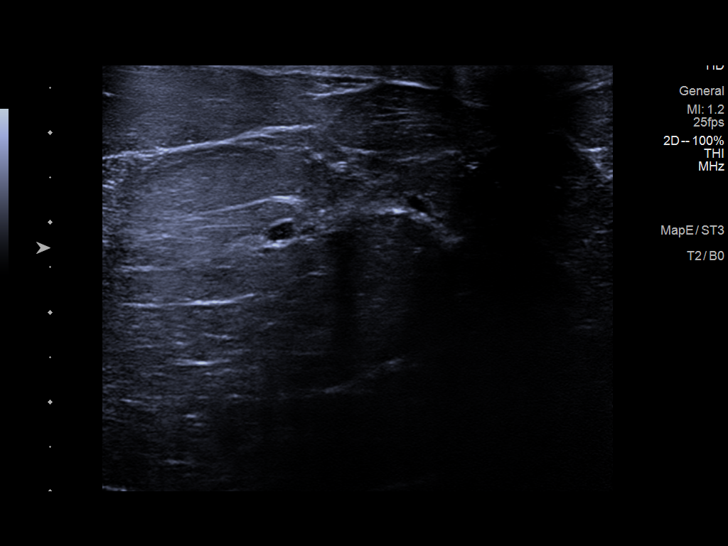
[im 2/11]
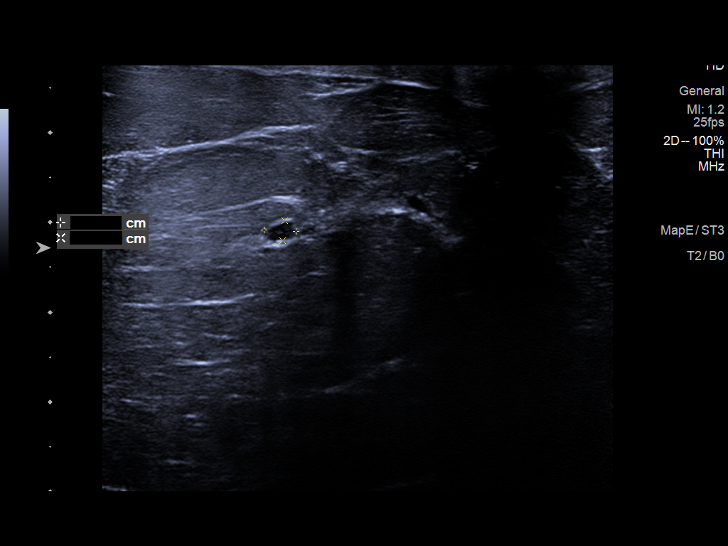
[im 3/11]
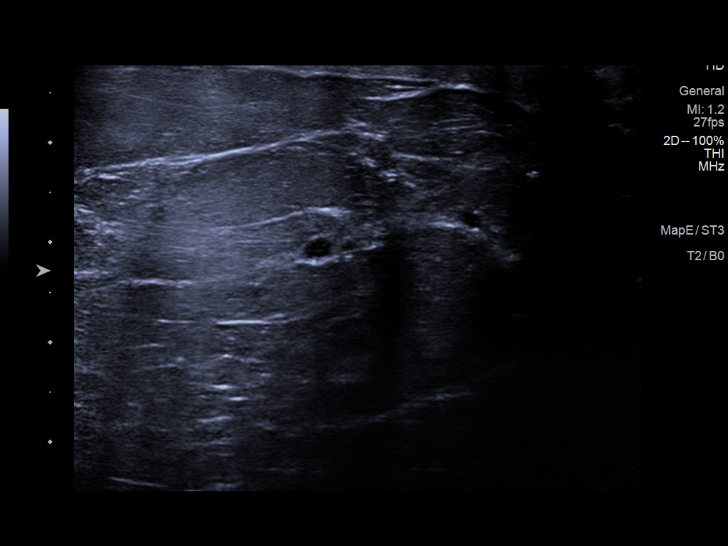
[im 4/11]
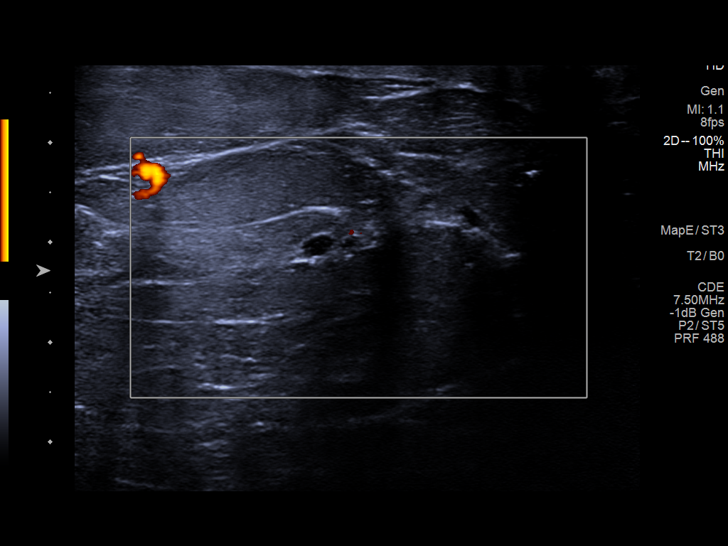
[im 5/11]
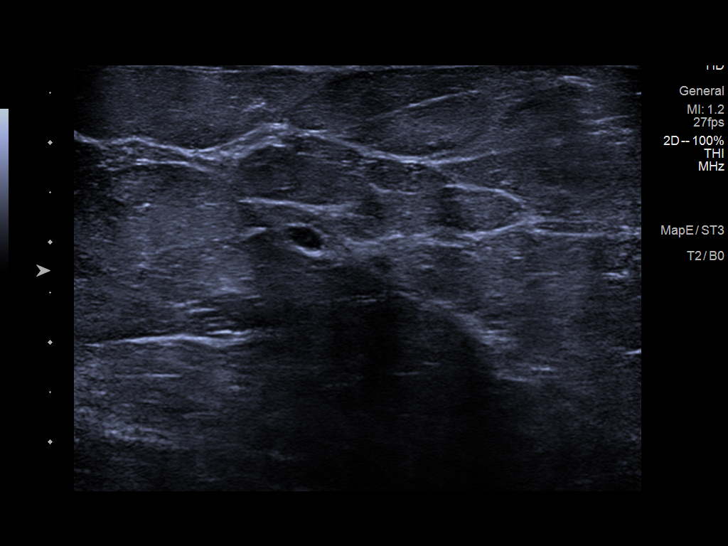
[im 6/11]
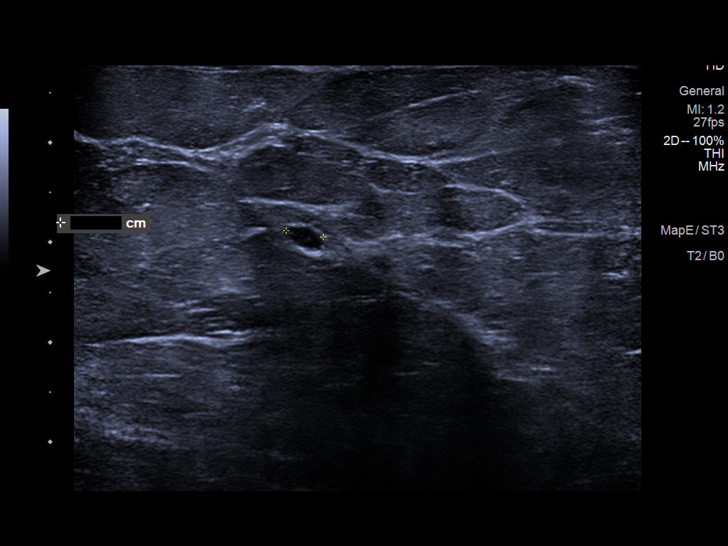
[im 7/11]
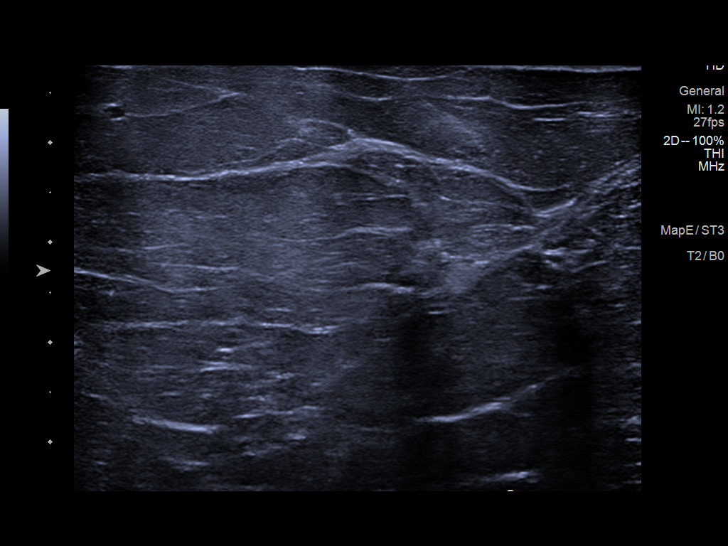
[im 8/11]
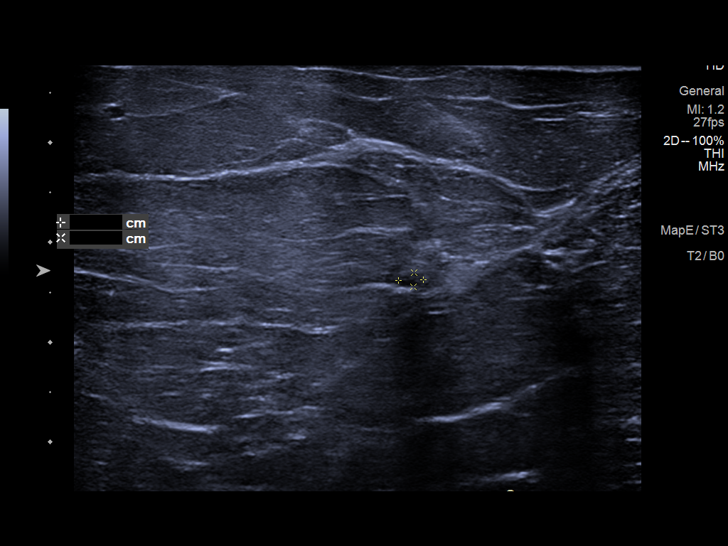
[im 9/11]
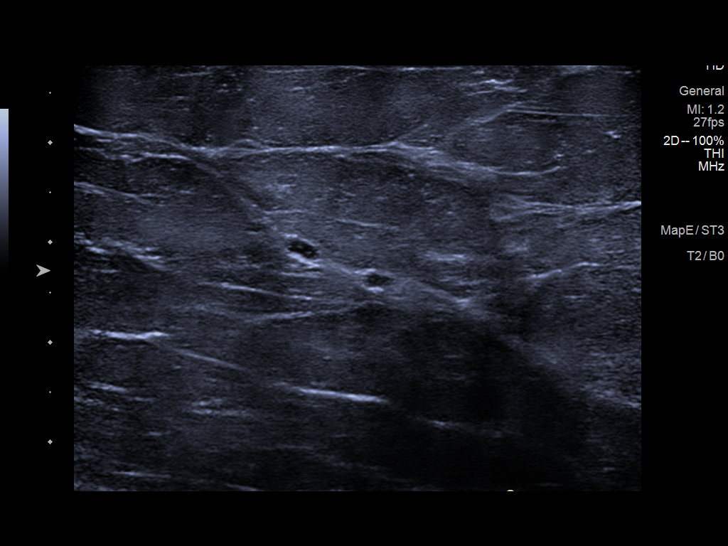
[im 10/11]
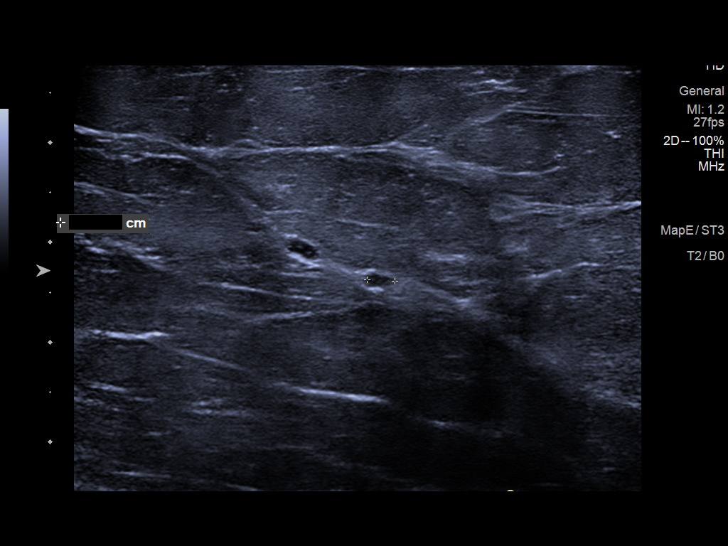
[im 11/11]
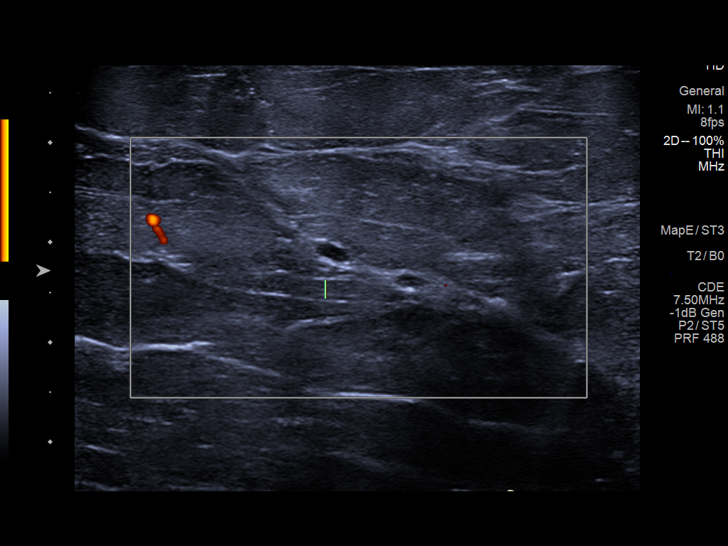

[11 of 11 positions shown; findings below may reference images not displayed]

FINDINGS: RIGHT BREAST:

Mammogram: Additional 2-D and 3-D images are performed. These views
confirm presence of a circumscribed mass in the LATERAL aspect RIGHT
breast. Mammographic images were processed with CAD.

Ultrasound: Targeted ultrasound is performed, showing a
circumscribed oval hypoechoic parallel mass in the 10 o'clock
location of RIGHT breast 7 centimeters from the nipple which
measures 0.7 x 0.3 x 0.6 centimeters. Central hyperechoic tissue and
central blood flow are consistent with benign intramammary lymph
node.

LEFT BREAST:

Mammogram: Additional 2-D and 3-D images are performed. These views
confirm presence of a circumscribed mass in the retroareolar region
of the breast. Mammographic images were processed with CAD.

Ultrasound: Targeted ultrasound is performed, showing adjacent
simple cyst in the 12 o'clock retroareolar region of the LEFT breast
which measure 0.3 and 0.4 centimeters. No internal blood flow or
solid masses identified.
IMPRESSION: 1. Benign intramammary lymph node in the RIGHT breast.
2. Fibrocystic changes in breast.
3.  No mammographic or ultrasound evidence for malignancy.

RECOMMENDATION:
Recommend screening mammogram in 1 year.

I have discussed the findings and recommendations with the patient.
If applicable, a reminder letter will be sent to the patient
regarding the next appointment.

BI-RADS CATEGORY  2: Benign.

## 2023-05-31 IMAGING — US US BREAST*R* LIMITED INC AXILLA
1 series · 6 of 6 positions shown · non-contrast
Comparison: Previous exam(s).

ACR Breast Density Category a: The breast tissue is almost entirely
fatty.

CLINICAL DATA: Patient returns after screening for evaluation of
possible bilateral breast masses.



[Series 1: us breast*right* limited inc axilla · 0.06mm/px · 6 of 6 slices shown]
[im 1/6]
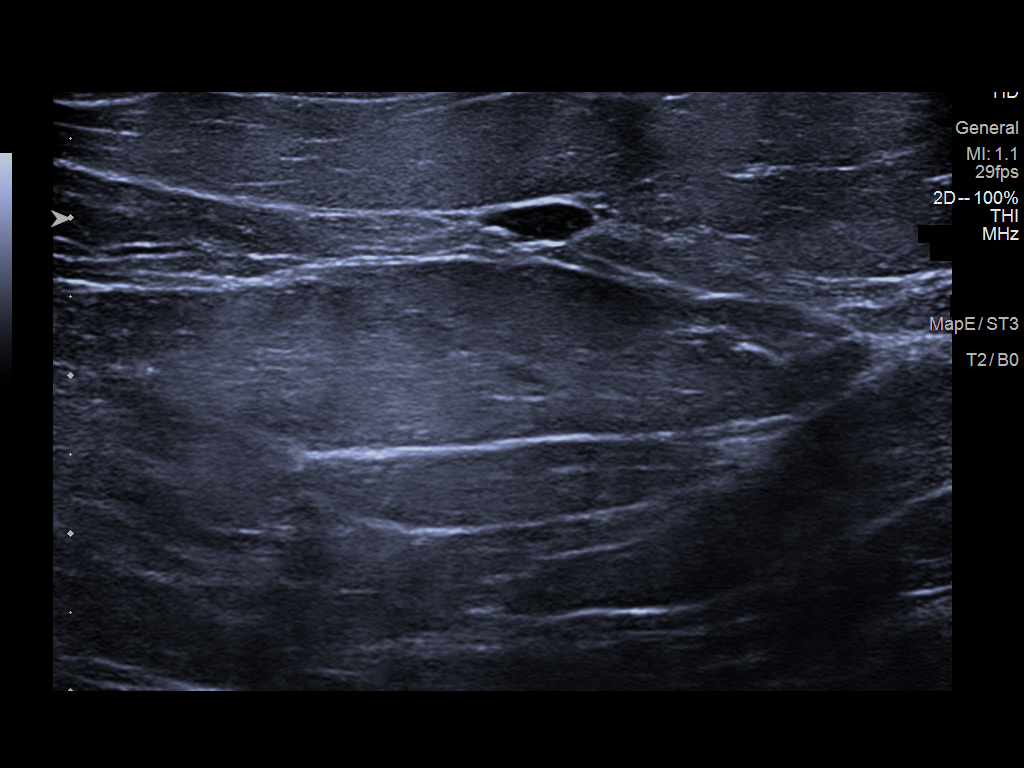
[im 2/6]
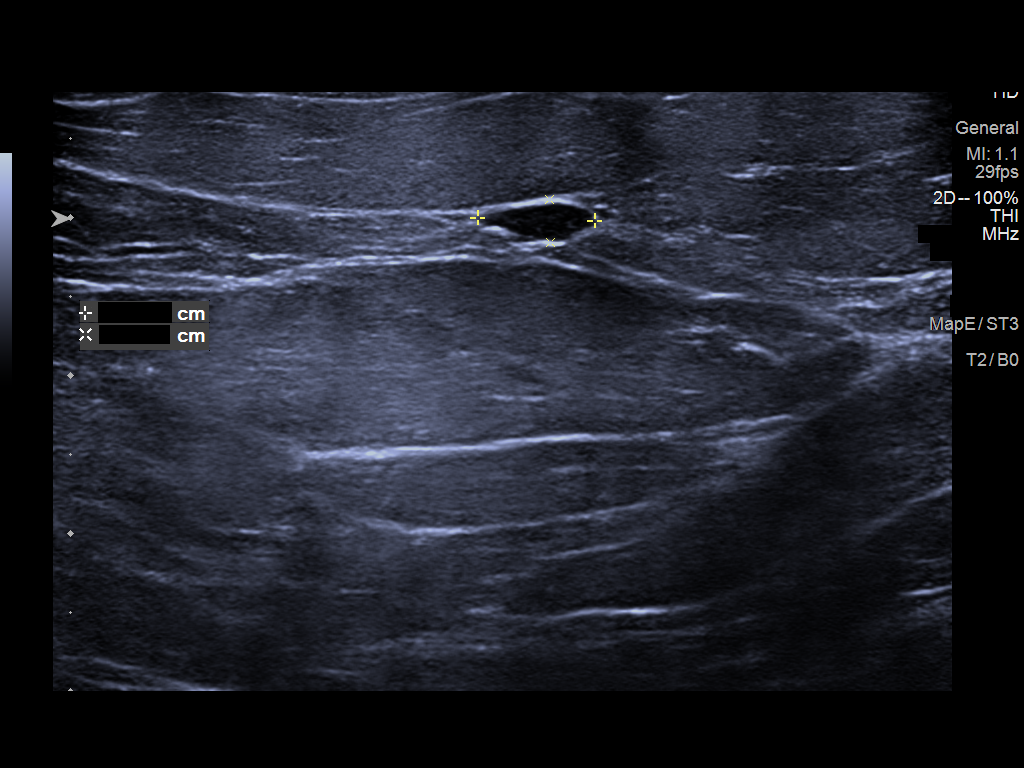
[im 3/6]
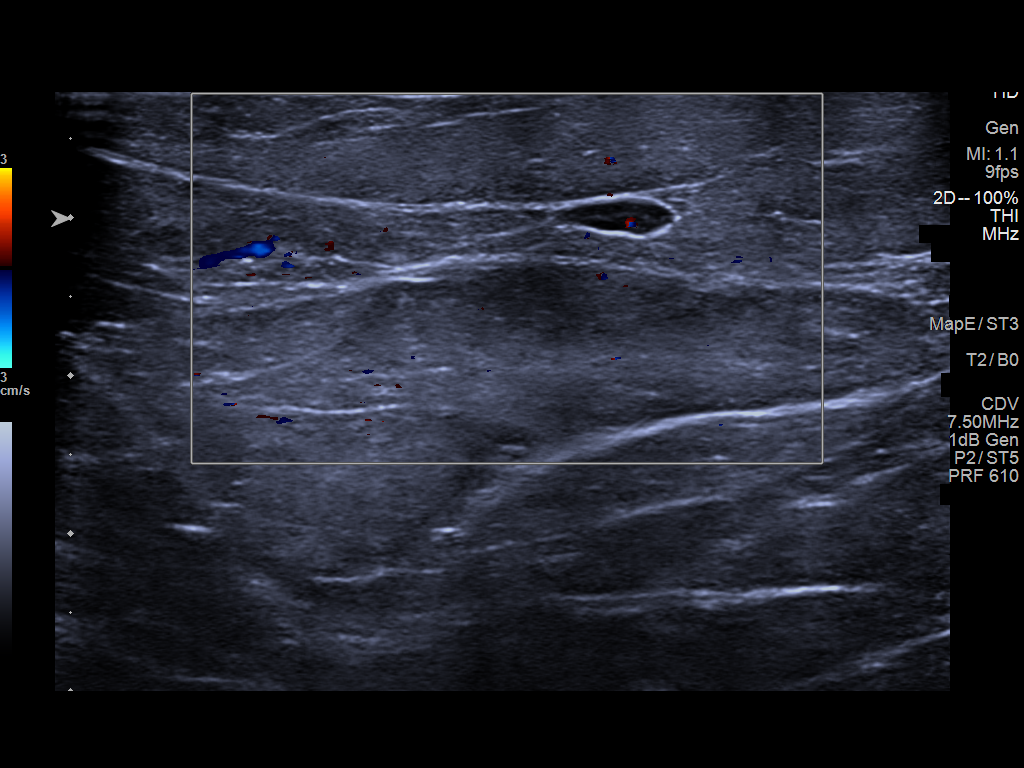
[im 4/6]
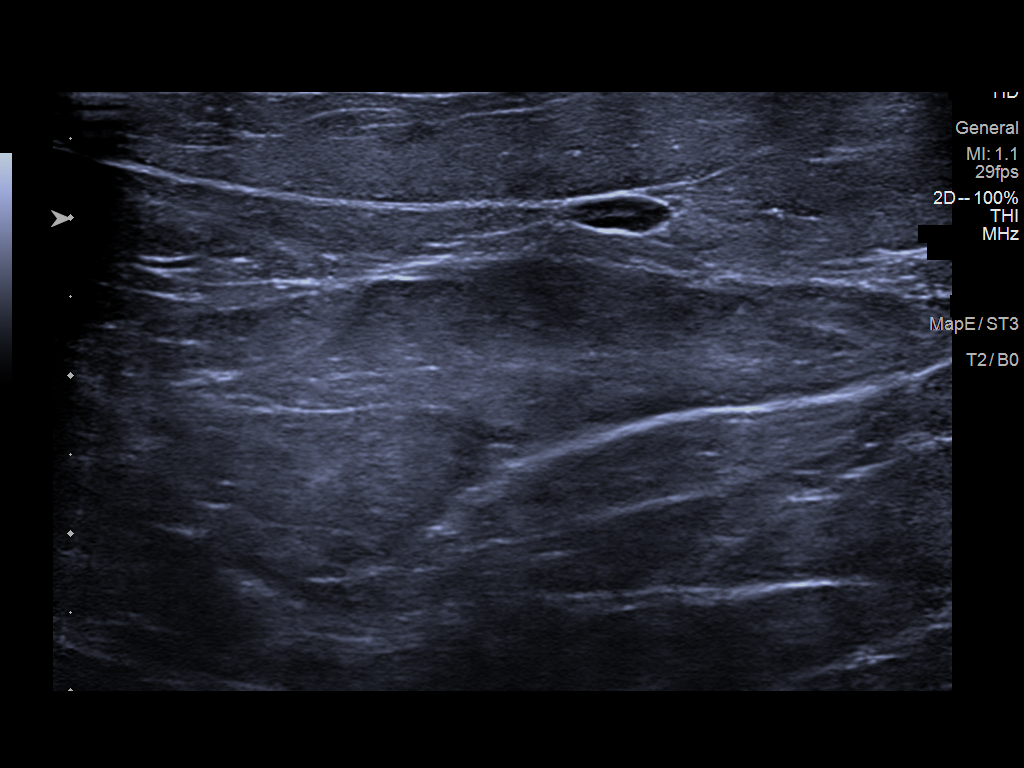
[im 5/6]
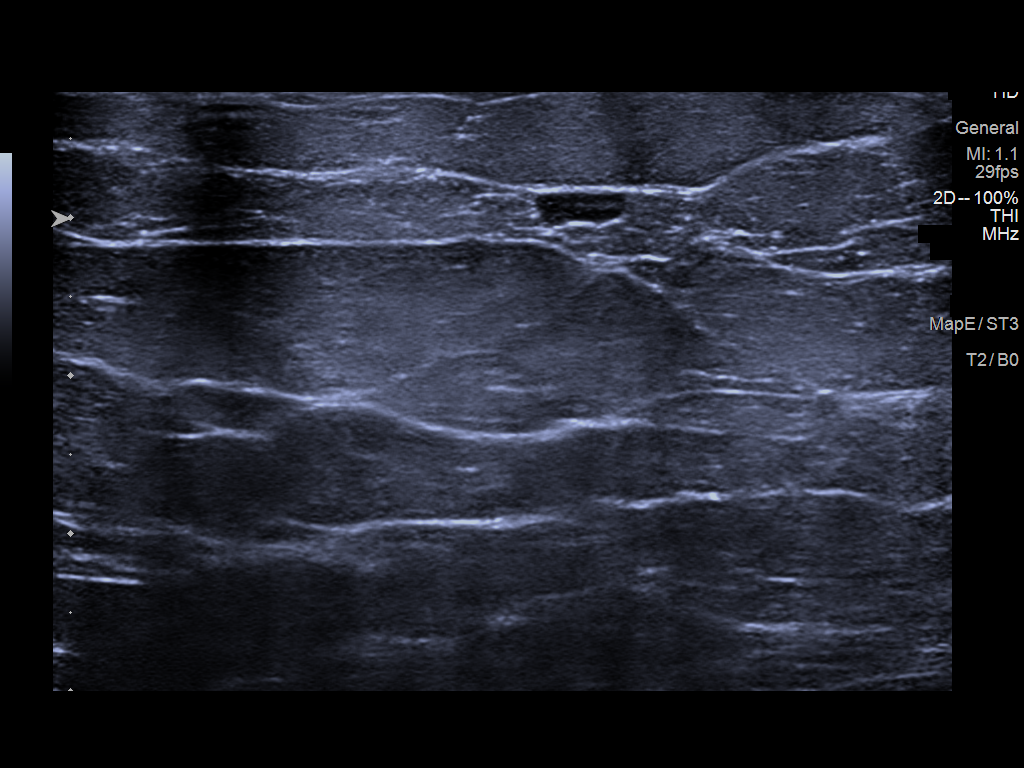
[im 6/6]
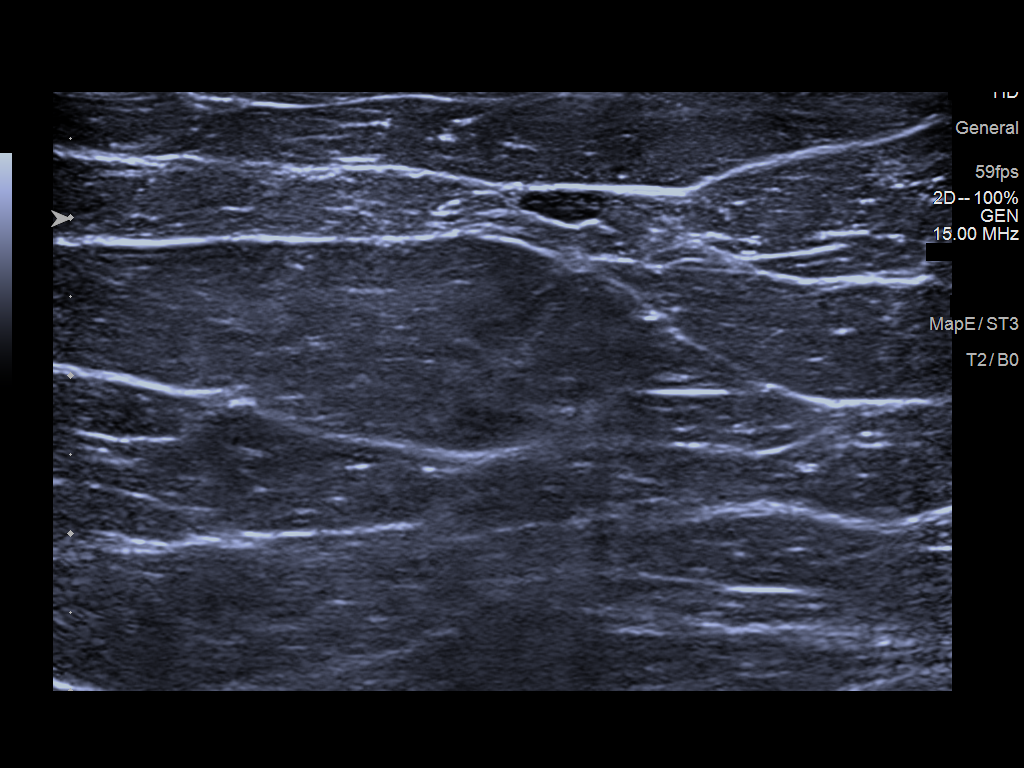

[6 of 6 positions shown; findings below may reference images not displayed]

FINDINGS: RIGHT BREAST:

Mammogram: Additional 2-D and 3-D images are performed. These views
confirm presence of a circumscribed mass in the LATERAL aspect RIGHT
breast. Mammographic images were processed with CAD.

Ultrasound: Targeted ultrasound is performed, showing a
circumscribed oval hypoechoic parallel mass in the 10 o'clock
location of RIGHT breast 7 centimeters from the nipple which
measures 0.7 x 0.3 x 0.6 centimeters. Central hyperechoic tissue and
central blood flow are consistent with benign intramammary lymph
node.

LEFT BREAST:

Mammogram: Additional 2-D and 3-D images are performed. These views
confirm presence of a circumscribed mass in the retroareolar region
of the breast. Mammographic images were processed with CAD.

Ultrasound: Targeted ultrasound is performed, showing adjacent
simple cyst in the 12 o'clock retroareolar region of the LEFT breast
which measure 0.3 and 0.4 centimeters. No internal blood flow or
solid masses identified.
IMPRESSION: 1. Benign intramammary lymph node in the RIGHT breast.
2. Fibrocystic changes in breast.
3.  No mammographic or ultrasound evidence for malignancy.

RECOMMENDATION:
Recommend screening mammogram in 1 year.

I have discussed the findings and recommendations with the patient.
If applicable, a reminder letter will be sent to the patient
regarding the next appointment.

BI-RADS CATEGORY  2: Benign.

## 2023-06-09 ENCOUNTER — Other Ambulatory Visit: Payer: Self-pay | Admitting: Family Medicine

## 2023-06-09 DIAGNOSIS — M1712 Unilateral primary osteoarthritis, left knee: Secondary | ICD-10-CM

## 2023-06-09 DIAGNOSIS — M1711 Unilateral primary osteoarthritis, right knee: Secondary | ICD-10-CM

## 2023-06-09 NOTE — Telephone Encounter (Signed)
Pt needs an office visit

## 2023-06-09 NOTE — Telephone Encounter (Signed)
Patient will need an office visit for further refills. Requested Prescriptions  Pending Prescriptions Disp Refills   DULoxetine (CYMBALTA) 60 MG capsule [Pharmacy Med Name: Duloxetine 60mg  Delayed-Release Capsule] 90 capsule 0    Sig: Take 1 capsule by mouth daily.     Psychiatry: Antidepressants - SNRI - duloxetine Failed - 06/09/2023  4:34 PM      Failed - Valid encounter within last 6 months    Recent Outpatient Visits           7 months ago Encounter for weight management   [place] [facility], Ocie Bob, MD   9 months ago Annual physical exam   [facility] at [facility], Ocie Bob, MD   10 months ago Acute bacterial sinusitis   [place] [facility] at [facility], Ocie Bob, MD   11 months ago Primary osteoarthritis of left knee   [facility] at [facility], Ocie Bob, MD   1 year ago Acute bacterial sinusitis    Primary [facility] & Sports Medicine at [facility], Ocie Bob, MD              Passed - Cr in normal range and within 360 days    Creatinine, Ser  Date Value Ref Range Status  09/16/2022 0.71 0.57 - 1.00 mg/dL Final         Passed - eGFR is 30 or above and within 360 days    eGFR  Date Value Ref Range Status  09/16/2022 105 >59 mL/min/1.73 Final         Passed - Completed PHQ-2 or PHQ-9 in the last 360 days      Passed - Last BP in normal range    BP Readings from Last 1 Encounters:  10/16/22 128/78          montelukast (SINGULAIR) 10 MG tablet [Pharmacy Med Name: Montelukast Sodium 10mg  Tablet] 90 tablet 0    Sig: Take 1 tablet by mouth at bedtime.     Pulmonology:  Leukotriene Inhibitors Passed - 06/09/2023  4:34 PM      Passed - Valid encounter within last 12 months    Recent Outpatient Visits           7 months ago Encounter for weight management   [facility] at [facility], Ocie Bob, MD   9 months ago Annual physical exam   [facility] at [facility], Ocie Bob, MD   10 months ago Acute bacterial sinusitis   [facility] at [facility], Ocie Bob, MD   11 months ago Primary osteoarthritis of left knee   [facility] Primary Care & Sports Medicine at [facility], Ocie Bob, MD   1 year ago Acute bacterial sinusitis   [facility] & [facility] Medicine at [facility], Ocie Bob, MD

## 2023-06-11 ENCOUNTER — Encounter: Payer: Self-pay | Admitting: *Deleted

## 2023-06-11 NOTE — Telephone Encounter (Signed)
Requested medication (s) are due for refill today:   Yes  Requested medication (s) are on the active medication list:   Yes  Future visit scheduled:   No     MyChart message sent to call in or use MyChart to schedule her 6 month check up   Last ordered: 02/17/2023 #90, 0 refills  Unable to refill because a 6 month visit is due per protocol.   Requested Prescriptions  Pending Prescriptions Disp Refills   DULoxetine (CYMBALTA) 60 MG capsule [Pharmacy Med Name: Duloxetine 60mg  Delayed-Release Capsule] 90 capsule 0    Sig: Take 1 capsule by mouth daily.     Psychiatry: Antidepressants - SNRI - duloxetine Failed - 06/09/2023  4:34 PM      Failed - Valid encounter within last 6 months    Recent Outpatient Visits           7 months ago Encounter for weight management   [place] [facility], Ocie Bob, MD   9 months ago Annual physical exam   [facility] at [facility], Ocie Bob, MD   10 months ago Acute bacterial sinusitis   [place] [facility] at [facility], Ocie Bob, MD   11 months ago Primary osteoarthritis of left knee   [facility] at [facility], Ocie Bob, MD   1 year ago Acute bacterial sinusitis   [place] [facility] at [facility], Ocie Bob, MD              Passed - Cr in normal range and within 360 days    Creatinine, Ser  Date Value Ref Range Status  09/16/2022 0.71 0.57 - 1.00 mg/dL Final         Passed - eGFR is 30 or above and within 360 days    eGFR  Date Value Ref Range Status  09/16/2022 105 >59 mL/min/1.73 Final         Passed - Completed PHQ-2 or PHQ-9 in the last 360 days      Passed - Last BP in normal range    BP Readings from Last 1 Encounters:  10/16/22 128/78         Signed Prescriptions Disp Refills   montelukast (SINGULAIR) 10 MG  tablet 90 tablet 0    Sig: Take 1 tablet by mouth at bedtime.     Pulmonology:  Leukotriene Inhibitors Passed - 06/09/2023  4:34 PM      Passed - Valid encounter within last 12 months    Recent Outpatient Visits           7 months ago Encounter for weight management   [place] [facility] at [facility], Ocie Bob, MD   9 months ago Annual physical exam   [facility] Care & [facility] Medicine at [facility], Ocie Bob, MD   10 months ago Acute bacterial sinusitis   [employer] [facility] Primary Care & Sports Medicine at [facility], Ocie Bob, MD   11 months ago Primary osteoarthritis of left knee   [facility] at [facility], Ocie Bob, MD   1 year ago Acute bacterial sinusitis   [facility] Primary Care & Sports Medicine at [facility], Ocie Bob, MD

## 2023-06-12 NOTE — Telephone Encounter (Signed)
Please review.  KP

## 2023-06-20 ENCOUNTER — Other Ambulatory Visit: Payer: Self-pay | Admitting: Family Medicine

## 2023-06-22 NOTE — Telephone Encounter (Signed)
No longer on Metformin, discontinued on 08/05/22, will refuse this request.  Requested Prescriptions  Pending Prescriptions Disp Refills   metFORMIN (GLUCOPHAGE) 500 MG tablet [Pharmacy Med Name: Metformin Hydrochloride 500mg  Tablet] 180 tablet 2    Sig: Take 1 tablet by mouth twice daily with meals.     Endocrinology:  Diabetes - Biguanides Failed - 06/22/2023  1:32 PM      Failed - HBA1C is between 0 and 7.9 and within 180 days    No results found for: "HGBA1C", "LABA1C"       Failed - B12 Level in normal range and within 720 days    No results found for: "VITAMINB12"       Failed - Valid encounter within last 6 months    Recent Outpatient Visits           8 months ago Encounter for weight management   [place] [facility], Ocie Bob, MD   9 months ago Annual physical exam   [facility] & [facility] at [facility], Ocie Bob, MD   10 months ago Acute bacterial sinusitis   [place] [facility] at [facility], Ocie Bob, MD   11 months ago Primary osteoarthritis of left knee   [place] [facility] at [facility], Ocie Bob, MD   1 year ago Acute bacterial sinusitis   [place] [facility] at [facility], Ocie Bob, MD              Failed - CBC within normal limits and completed in the last 12 months    WBC  Date Value Ref Range Status  09/16/2022 5.2 3.4 - 10.8 x10E3/uL Final   RBC  Date Value Ref Range Status  09/16/2022 4.25 3.77 - 5.28 x10E6/uL Final   Hemoglobin  Date Value Ref Range Status  09/16/2022 13.8 11.1 - 15.9 g/dL Final   Hematocrit  Date Value Ref Range Status  09/16/2022 41.1 34.0 - 46.6 % Final   MCHC  Date Value Ref Range Status  09/16/2022 33.6 31.5 - 35.7 g/dL Final   [facility]  Date Value Ref Range Status  09/16/2022 32.5 26.6 - 33.0 pg Final   MCV  Date Value Ref  Range Status  09/16/2022 97 79 - 97 fL Final   No results found for: "PLTCOUNTKUC", "LABPLAT", "POCPLA" RDW  Date Value Ref Range Status  09/16/2022 12.3 11.7 - 15.4 % Final         Passed - Cr in normal range and within 360 days    Creatinine, Ser  Date Value Ref Range Status  09/16/2022 0.71 0.57 - 1.00 mg/dL Final         Passed - eGFR in normal range and within 360 days    eGFR  Date Value Ref Range Status  09/16/2022 105 >59 mL/min/1.73 Final

## 2023-08-16 ENCOUNTER — Other Ambulatory Visit: Payer: Self-pay | Admitting: Family Medicine

## 2023-08-17 NOTE — Telephone Encounter (Signed)
 Requested Prescriptions  Refused Prescriptions Disp Refills   montelukast  (SINGULAIR ) 10 MG tablet [Pharmacy Med Name: Montelukast  Sodium 10mg  Tablet] 90 tablet 0    Sig: Take 1 tablet by mouth at bedtime. Office visit needed for further refills.     Pulmonology:  Leukotriene Inhibitors Passed - 08/17/2023  5:37 PM      Passed - Valid encounter within last 12 months    Recent Outpatient Visits           10 months ago Encounter for weight management   [place] [facility], Dessie Flow, MD   11 months ago Annual physical exam   [facility] at [facility], Dessie Flow, MD   1 year ago Acute bacterial sinusitis   [facility] Primary [facility] Medicine at [facility], Dessie Flow, MD   1 year ago Primary osteoarthritis of left knee   [facility] at [facility], Dessie Flow, MD   1 year ago Acute bacterial sinusitis   [facility] Primary Care & [facility] Medicine at [facility], Dessie Flow, MD

## 2023-08-21 ENCOUNTER — Other Ambulatory Visit: Payer: Self-pay | Admitting: Family Medicine

## 2023-08-21 DIAGNOSIS — M1711 Unilateral primary osteoarthritis, right knee: Secondary | ICD-10-CM

## 2023-08-21 DIAGNOSIS — M1712 Unilateral primary osteoarthritis, left knee: Secondary | ICD-10-CM

## 2023-08-24 ENCOUNTER — Ambulatory Visit
Admission: EM | Admit: 2023-08-24 | Discharge: 2023-08-24 | Disposition: A | Discharge status: Home / Self Care | Payer: Managed Care, Other (non HMO) | Attending: Family Medicine | Admitting: Family Medicine

## 2023-08-24 DIAGNOSIS — J101 Influenza due to other identified influenza virus with other respiratory manifestations: Secondary | ICD-10-CM | POA: Diagnosis present

## 2023-08-24 LAB — RESP PANEL BY RT-PCR (FLU A&B, COVID) ARPGX2
Influenza A by PCR: POSITIVE — AB
Influenza B by PCR: NEGATIVE
SARS Coronavirus 2 by RT PCR: NEGATIVE

## 2023-08-24 MED ORDER — OSELTAMIVIR PHOSPHATE 75 MG PO CAPS: 75.0000 mg | ORAL_CAPSULE | Freq: Two times a day (BID) | ORAL | 0 refills | Status: DC

## 2023-08-24 NOTE — ED Provider Notes (Signed)
 [facility]    CSN: [id number] Arrival date & time: 08/24/23  1041      History   Chief Complaint Chief Complaint  Patient presents with   Cough   Fever   Headache   Generalized Body Aches    HPI Tammie Grimes is a 48 y.o. female.   HPI  History obtained from the patient. [name] presents for cough, chest tightness, chills, fever, headache and body aches. Has been taking Tylenol which helped.  She didn't take her temperature but she felt warm.  Has sensitive skin to the tough. Has chest tightness and pain with deep breathing. Has been coughing up "lumpy  yucky stuff."   Has slight sore throat from coughing.   Quit smoking about 20 years ago.  No history of asthma.      Past Medical History:  Diagnosis Date   Allergy     Patient Active Problem List   Diagnosis Date Noted   Adenomatous polyp of colon 09/24/2022   Encounter for screening colonoscopy 09/12/2022   Colon cancer screening 07/10/2022   Acute bacterial sinusitis 04/21/2022   Vertigo, benign paroxysmal, left 04/21/2022   Encounter for weight management 04/21/2022   Flu vaccine need 05/20/2021   Primary osteoarthritis of right knee 04/10/2021   Primary osteoarthritis of left knee 04/10/2021   Plantar fasciitis 10/23/2020   Allergic rhinitis 05/24/2019   Allergy 05/24/2019   Depo-Provera contraceptive status 05/24/2019   Depression with anxiety 05/24/2019    Past Surgical History:  Procedure Laterality Date   COLONOSCOPY WITH PROPOFOL N/A 09/24/2022   Procedure: COLONOSCOPY WITH PROPOFOL;  Surgeon: Wyline Mood, MD;  Location: [facility] ENDOSCOPY;  Service: Gastroenterology;  Laterality: N/A;    OB History   No obstetric history on file.      Home Medications    Prior to Admission medications   Medication Sig Start Date End Date Taking? Authorizing Provider  DULoxetine (CYMBALTA) 60 MG capsule Take 1 capsule by mouth daily. 08/24/23  Yes Jerrol Banana, MD  fluticasone  (FLONASE) 50 MCG/ACT nasal spray Place 1 spray into both nostrils daily.   Yes [provider]  oseltamivir (TAMIFLU) 75 MG capsule Take 1 capsule (75 mg total) by mouth every 12 (twelve) hours. 08/24/23  Yes Monique Hefty, DO  ACETAMINOPHEN PO Take 1,000 mg by mouth every 6 (six) hours as needed.    [provider]  liraglutide (VICTOZA) 18 MG/3ML SOPN Inject 0.6 mg into the skin daily. 0.6 mg once daily for 1 week; increase by 0.6 mg daily at weekly intervals to a target dose of 1.8 mg once daily 09/12/22   Jerrol Banana, MD  loratadine (CLARITIN) 10 MG tablet Take 10 mg by mouth daily.    [provider]  meloxicam (MOBIC) 15 MG tablet Take 1 tablet (15 mg total) by mouth daily as needed for pain. 09/12/22   Jerrol Banana, MD  montelukast (SINGULAIR) 10 MG tablet Take 1 tablet by mouth at bedtime. 06/09/23   Jerrol Banana, MD  phentermine 30 MG capsule Take 1 capsule by mouth every morning. 12/12/22   Jerrol Banana, MD  topiramate (TOPAMAX) 25 MG capsule Take 1 capsule by mouth daily. 12/18/22   Jerrol Banana, MD  Vitamin D, Ergocalciferol, (DRISDOL) 1.25 MG (50000 UNIT) CAPS capsule Take 1 capsule (50,000 Units total) by mouth every 7 (seven) days. Take for 8 total doses(weeks) 09/17/22   Jerrol Banana, MD    Family History Family History  Problem Relation Age of Onset   Breast cancer Neg Hx     Social History Social History   Tobacco Use   Smoking status: Former    Current packs/day: 0.00    Types: Cigarettes    Quit date: 2007    Years since quitting: 18.1   Smokeless tobacco: Never  Vaping Use   Vaping status: Never Used  Substance Use Topics   Alcohol use: Yes    Alcohol/week: 14.0 standard drinks of alcohol    Types: 14 Standard drinks or equivalent per week    Comment: daily   Drug use: Never     Allergies   Other   Review of Systems Review of Systems: negative unless otherwise stated in HPI.      Physical  Exam Triage Vital Signs ED Triage Vitals  Encounter Vitals Group     BP 08/24/23 1140 (!) 145/96     Systolic BP Percentile --      Diastolic BP Percentile --      Pulse Rate 08/24/23 1140 (!) 110     Resp 08/24/23 1140 19     Temp 08/24/23 1140 99.8 F (37.7 C)     Temp Source 08/24/23 1140 Oral     SpO2 08/24/23 1140 99 %     Weight --      Height --      Head Circumference --      Peak Flow --      Pain Score 08/24/23 1139 3     Pain Loc --      Pain Education --      Exclude from Growth Chart --    No data found.  Updated Vital Signs BP (!) 145/96 (BP Location: Right Arm)   Pulse (!) 110   Temp 99.8 F (37.7 C) (Oral)   Resp 19   SpO2 99%   Visual Acuity Right Eye Distance:   Left Eye Distance:   Bilateral Distance:    Right Eye Near:   Left Eye Near:    Bilateral Near:     Physical Exam GEN:     alert, non-toxic appearing female in no distress    HENT:  mucus membranes moist, oropharyngeal without lesions or erythema, no tonsillar hypertrophy or exudates,  clear nasal discharge EYES:   pupils equal and reactive, no scleral injection or discharge NECK:  normal ROM, no meningismus   RESP:  no increased work of breathing, clear to auscultation bilaterally CVS:   regular rate and rhythm Skin:   warm and dry, no rash on visible skin    UC Treatments / Results  Labs (all labs ordered are listed, but only abnormal results are displayed) Labs Reviewed  RESP PANEL BY RT-PCR (FLU A&B, COVID) ARPGX2 - Abnormal; Notable for the following components:      Result Value   Influenza A by PCR POSITIVE (*)    All other components within normal limits    EKG   Radiology No results found.   Procedures Procedures (including critical care time)  Medications Ordered in UC Medications - No data to display  Initial Impression / Assessment and Plan / UC Course  I have reviewed the triage vital signs and the nursing notes.  Pertinent labs & imaging results that  were available during my care of the patient were reviewed by me and considered in my medical decision making (see chart for details).       Pt is a 48 y.o. female who presents  for 2 days of respiratory symptoms. [name] is tachycardic, hypertensive with an elevated temperature of 90 9.50F here.[name] well on room air. Overall pt is ill but non-toxic appearing, well hydrated, without respiratory distress. Pulmonary exam is unremarkable.  COVID and influenza panel obtained and was influenza A positive.  Tamiflu risk and benefits discussed and she would like to proceed with this medication.  Tamiflu prescribed as below.  Discussed symptomatic treatment.  Typical duration of symptoms discussed.   Return and ED precautions given and voiced understanding. Discussed MDM, treatment plan and plan for follow-up with patient who agrees with plan.     Final Clinical Impressions(s) / UC Diagnoses   Final diagnoses:  Influenza A with respiratory manifestations     Discharge Instructions      You have influenza A.  Tamiflu was prescribed.  Your symptoms will gradually improve over the next 7 to 10 days.  The cough may last about 3 weeks.   Take ibuprofen 600 mg with Tylenol 1000 mg for fever, headache or body aches.   For cough:  You can also use guaifenesin and dextromethorphan for cough. You can use a humidifier for chest congestion and cough.  If you don't have a humidifier, you can sit in the bathroom with the hot shower running.      For sore throat: try warm salt water gargles, Mucinex sore throat cough drops or cepacol lozenges, throat spray, warm tea or water with lemon/honey, popsicles or ice, or OTC cold relief medicine for throat discomfort. You can also purchase chloraseptic spray at the pharmacy or dollar store.   For congestion: take a daily anti-histamine like Zyrtec, Claritin, and a oral decongestant, such as pseudoephedrine.  You can also use Flonase 1-2 sprays in each nostril  daily. Afrin is also a good option, if you do not have high blood pressure.    It is important to stay hydrated: drink plenty of fluids (water, gatorade/powerade/pedialyte, juices, or teas) to keep your throat moisturized and help further relieve irritation/discomfort.    Return or go to the Emergency Department if symptoms worsen or do not improve in the next few days      ED Prescriptions     Medication Sig Dispense Auth. Provider   oseltamivir (TAMIFLU) 75 MG capsule Take 1 capsule (75 mg total) by mouth every 12 (twelve) hours. 10 capsule Katha Cabal, DO      PDMP not reviewed this encounter.   Katha Cabal, DO 08/31/23 1225

## 2023-08-24 NOTE — Telephone Encounter (Signed)
Pt needs an OV  

## 2023-08-24 NOTE — Telephone Encounter (Signed)
 Pt needs an appointment

## 2023-08-24 NOTE — ED Triage Notes (Signed)
 Sx x 2 days  Productive cough green mucus Fever Headache Bodyache

## 2023-08-24 NOTE — Discharge Instructions (Signed)
 You have influenza A.  Tamiflu wasprescribed.  Your symptoms will gradually improve over the next 7 to 10 days.  The cough may last about 3 weeks.   Take ibuprofen 600 mg with Tylenol 1000 mg for fever, headache or body aches.   For cough: You can also use guaifenesin and dextromethorphan for cough. You can use a humidifier for chest congestion and cough.  If you don't have a humidifier, you can sit in the bathroom with the hot shower running.      For sore throat: try warm salt water gargles, Mucinex sore throat cough drops or cepacol lozenges, throat spray, warm tea or water with lemon/honey, popsicles or ice, or OTC cold relief medicine for throat discomfort. You can also purchase chloraseptic spray at the pharmacy or dollar store.   For congestion: take a daily anti-histamine like Zyrtec, Claritin, and a oral decongestant, such as pseudoephedrine.  You can also use Flonase 1-2 sprays in each nostril daily. Afrin is also a good option, if you do not have high blood pressure.    It is important to stay hydrated: drink plenty of fluids (water, gatorade/powerade/pedialyte, juices, or teas) to keep your throat moisturized and help further relieve irritation/discomfort.    Return or go to the Emergency Department if symptoms worsen or do not improve in the next few days

## 2023-08-24 NOTE — Telephone Encounter (Signed)
 OV needed for additional refills, 30 day supply given until OV can be made.  Requested Prescriptions  Pending Prescriptions Disp Refills   DULoxetine (CYMBALTA) 60 MG capsule [Pharmacy Med Name: Duloxetine 60mg  Delayed-Release Capsule] 30 capsule 0    Sig: Take 1 capsule by mouth daily.     Psychiatry: Antidepressants - SNRI - duloxetine Failed - 08/24/2023 11:35 AM      Failed - Completed PHQ-2 or PHQ-9 in the last 360 days      Failed - Valid encounter within last 6 months    Recent Outpatient Visits           10 months ago Encounter for weight management   [place] [facility], Ocie Bob, MD   11 months ago Annual physical exam   [facility] at [facility], Ocie Bob, MD   1 year ago Acute bacterial sinusitis   [place] [facility] at [facility], Ocie Bob, MD   1 year ago Primary osteoarthritis of left knee   [facility] Care & [facility] Medicine at [facility], Ocie Bob, MD   1 year ago Acute bacterial sinusitis   [facility] Medicine at [facility], Ocie Bob, MD              Passed - Cr in normal range and within 360 days    Creatinine, Ser  Date Value Ref Range Status  09/16/2022 0.71 0.57 - 1.00 mg/dL Final         Passed - eGFR is 30 or above and within 360 days    eGFR  Date Value Ref Range Status  09/16/2022 105 >59 mL/min/1.73 Final         Passed - Last BP in normal range    BP Readings from Last 1 Encounters:  10/16/22 128/78

## 2023-08-31 ENCOUNTER — Other Ambulatory Visit: Payer: Self-pay | Admitting: Family Medicine

## 2023-08-31 DIAGNOSIS — M1712 Unilateral primary osteoarthritis, left knee: Secondary | ICD-10-CM

## 2023-08-31 DIAGNOSIS — M1711 Unilateral primary osteoarthritis, right knee: Secondary | ICD-10-CM

## 2023-09-01 NOTE — Telephone Encounter (Signed)
 Requested medication (s) are due for refill today: yes  Requested medication (s) are on the active medication list: yes  Last refill:  08/24/23  Future visit scheduled: no  Notes to clinic:  Unable to refill per protocol, courtesy refill already given, routing for provider approval.      Requested Prescriptions  Pending Prescriptions Disp Refills   DULoxetine (CYMBALTA) 60 MG capsule [Pharmacy Med Name: Duloxetine 60mg  Delayed-Release Capsule] 30 capsule 0    Sig: Take 1 capsule by mouth daily.     Psychiatry: Antidepressants - SNRI - duloxetine Failed - 09/01/2023  2:56 PM      Failed - Completed PHQ-2 or PHQ-9 in the last 360 days      Failed - Last BP in normal range    BP Readings from Last 1 Encounters:  08/24/23 (!) 145/96         Failed - Valid encounter within last 6 months    Recent Outpatient Visits           10 months ago Encounter for weight management   [facility], Ocie Bob, MD   11 months ago Annual physical exam   [facility] at [facility], Ocie Bob, MD   1 year ago Acute bacterial sinusitis   [place] [facility], Ocie Bob, MD   1 year ago Primary osteoarthritis of left knee   [facility], Ocie Bob, MD   1 year ago Acute bacterial sinusitis   [facility] at [facility], Ocie Bob, MD              Passed - Cr in normal range and within 360 days    Creatinine, Ser  Date Value Ref Range Status  09/16/2022 0.71 0.57 - 1.00 mg/dL Final         Passed - eGFR is 30 or above and within 360 days    eGFR  Date Value Ref Range Status  09/16/2022 105 >59 mL/min/1.73 Final

## 2023-09-30 ENCOUNTER — Other Ambulatory Visit: Payer: Self-pay | Admitting: Family Medicine

## 2023-09-30 DIAGNOSIS — Z1231 Encounter for screening mammogram for malignant neoplasm of breast: Secondary | ICD-10-CM

## 2023-10-02 ENCOUNTER — Encounter: Payer: Self-pay | Admitting: Family Medicine

## 2023-10-02 ENCOUNTER — Ambulatory Visit (INDEPENDENT_AMBULATORY_CARE_PROVIDER_SITE_OTHER): Admitting: Family Medicine

## 2023-10-02 VITALS — BP 122/74 | HR 92 | Ht 65.0 in | Wt 329.0 lb

## 2023-10-02 DIAGNOSIS — M1712 Unilateral primary osteoarthritis, left knee: Secondary | ICD-10-CM | POA: Diagnosis not present

## 2023-10-02 DIAGNOSIS — Z136 Encounter for screening for cardiovascular disorders: Secondary | ICD-10-CM

## 2023-10-02 DIAGNOSIS — R7309 Other abnormal glucose: Secondary | ICD-10-CM

## 2023-10-02 DIAGNOSIS — Z0000 Encounter for general adult medical examination without abnormal findings: Secondary | ICD-10-CM | POA: Diagnosis not present

## 2023-10-02 DIAGNOSIS — E66813 Obesity, class 3: Secondary | ICD-10-CM

## 2023-10-02 DIAGNOSIS — Z6841 Body Mass Index (BMI) 40.0 and over, adult: Secondary | ICD-10-CM

## 2023-10-02 DIAGNOSIS — F330 Major depressive disorder, recurrent, mild: Secondary | ICD-10-CM

## 2023-10-02 MED ORDER — SEMAGLUTIDE-WEIGHT MANAGEMENT 0.25 MG/0.5ML ~~LOC~~ SOAJ: 0.2500 mg | SUBCUTANEOUS | 2 refills | Status: DC

## 2023-10-02 MED ORDER — BUPROPION HCL ER (XL) 150 MG PO TB24: 150.0000 mg | ORAL_TABLET | Freq: Every day | ORAL | 1 refills | Status: DC

## 2023-10-02 NOTE — Progress Notes (Signed)
 Annual Physical Exam Visit  Patient Information:  Patient ID: Tammie Grimes, female DOB: [birth year] Age: 48 y.o. MRN: [id number]   Subjective:   CC: Annual Physical Exam  HPI:  Tammie Grimes is here for their annual physical.  I reviewed the past medical history, family history, social history, surgical history, and allergies today and changes were made as necessary.  Please see the problem list section below for additional details.  Past Medical History: Past Medical History:  Diagnosis Date   Allergy    Past Surgical History: Past Surgical History:  Procedure Laterality Date   COLONOSCOPY WITH PROPOFOL N/A 09/24/2022   Procedure: COLONOSCOPY WITH PROPOFOL;  Surgeon: Wyline Mood, MD;  Location: [facility] ENDOSCOPY;  Service: Gastroenterology;  Laterality: N/A;   Family History: Family History  Problem Relation Age of Onset   Breast cancer Neg Hx    Allergies: Allergies  Allergen Reactions   Other Other (See Comments) and Itching    Dust mites    Health Maintenance: Health Maintenance  Topic Date Due   INFLUENZA VACCINE  10/05/2023 (Originally 02/05/2023)   Cervical Cancer Screening (HPV/Pap Cotest)  10/23/2025   DTaP/Tdap/Td (2 - Td or Tdap) 07/05/2028   Colonoscopy  09/23/2032   Hepatitis C Screening  Completed   HIV Screening  Completed   HPV VACCINES  Aged Out   COVID-19 Vaccine  Discontinued    HM Colonoscopy          Upcoming     Colonoscopy (Every 10 Years) Next due on 09/23/2032    09/24/2022  Surgical Procedure: COLONOSCOPY WITH PROPOFOL   Only the first 1 history entries have been loaded, but more history exists.               Medications: Current Outpatient Medications on File Prior to Visit  Medication Sig Dispense Refill   ACETAMINOPHEN PO Take 1,000 mg by mouth every 6 (six) hours as needed.     DULoxetine (CYMBALTA) 60 MG capsule Take 1 capsule by mouth daily. 30 capsule 0   fluticasone (FLONASE) 50 MCG/ACT nasal spray Place  1 spray into both nostrils daily.     loratadine (CLARITIN) 10 MG tablet Take 10 mg by mouth daily.     montelukast (SINGULAIR) 10 MG tablet Take 1 tablet by mouth at bedtime. 90 tablet 0   phentermine 30 MG capsule Take 1 capsule by mouth every morning. (Patient not taking: Reported on 10/02/2023) 30 capsule 0   topiramate (TOPAMAX) 25 MG capsule Take 1 capsule by mouth daily. (Patient not taking: Reported on 10/02/2023) 90 capsule 0   No current facility-administered medications on file prior to visit.    Objective:   Vitals:   10/02/23 0848  BP: 122/74  Pulse: 92  SpO2: 96%   Vitals:   10/02/23 0848  Weight: (!) 329 lb (149.2 kg)  Height: 5\' 5"  (1.651 m)   Body mass index is 54.75 kg/m.  General: Well Developed, well nourished, and in no acute distress.  Neuro: Alert and oriented x3, extra-ocular muscles intact, sensation grossly intact. Cranial nerves II through XII are grossly intact, motor, sensory, and coordinative functions are intact. HEENT: Normocephalic, atraumatic, neck supple, no masses, no lymphadenopathy, thyroid nonenlarged. Oropharynx, nasopharynx, external ear canals are unremarkable. Skin: Warm and dry, no rashes noted.  Cardiac: Regular rate and rhythm, no murmurs rubs or gallops. No peripheral edema. Pulses symmetric. Respiratory: Clear to auscultation bilaterally. Speaking in full sentences.  Abdominal: Soft, nontender, nondistended, positive bowel  sounds, no masses, no organomegaly. Musculoskeletal: Stable, and with full range of motion.   Impression and Recommendations:   The patient was counselled, risk factors were discussed, and anticipatory guidance given.  Problem List Items Addressed This Visit     Class 3 severe obesity due to excess calories with serious comorbidity and body mass index (BMI) of 50.0 to 59.9 in adult [facility])       She is experiencing challenges with weight loss, particularly due to insurance issues affecting medication access.  She is interested in exploring new medications and compounding options for weight management. She has previously used medications such as phentermine and topiramate, and has experience with Rybelsus, which she found effective in controlling appetite. She is considering compounding options for medications like Wegovy.  Weight management Obesity management discussed with focus on weight loss challenges and set point. Compounded semaglutide from [employer] considered for cost. Emphasized managing GI side effects and regular follow-up for dosage adjustment. - Send prescription for compounded semaglutide to [employer]. - Educate on managing side effects, particularly constipation and nausea. - Schedule follow-up every three months for weight management and medication adjustment. - Encourage lifestyle modifications including diet and exercise.  Sleep Apnea Suspected sleep apnea may affect mood, weight, and energy. Importance of diagnosis and treatment discussed. - Refer to sleep medicine for evaluation and possible home sleep study.     Relevant Medications   Semaglutide-Weight Management 0.25 MG/0.5ML SOAJ   Other Relevant Orders   Ambulatory referral to Pulmonology   Healthcare maintenance - Primary   Annual examination completed, risk stratification labs ordered, anticipatory guidance provided.  We will follow labs once resulted.      Relevant Orders   CBC (Completed)   Mild episode of recurrent major depressive disorder (HCC)   Mood and stress levels have been variable, not great but not awful. She recalls a previous positive experience with Wellbutrin for smoking cessation and is open to using it again for mood and weight management.  Mood / Stress Mood may improve with weight loss medications. Wellbutrin discussed for mood enhancement and synergy with weight loss.  - Prescribe Wellbutrin 150 mg once daily. - Adjust dosage based on response and insurance coverage. -  Monitor mood and adjust treatment as needed.      Relevant Medications   buPROPion (WELLBUTRIN XL) 150 MG 24 hr tablet   Primary osteoarthritis of left knee   Primary osteoarthritis of knees. Knee pain additionally affecting her foot due to compensatory mechanisms from insufficient knee strengthening. Celebrex advised given relative GI safety with newly prescribed Wegovy. - Prescribe Celebrex as needed, up to twice daily with food. - Encourage resumption of knee strengthening exercises. - Continue duloxetine for joint pain management. - Consider knee injections if pain persists.      Other Visit Diagnoses       Screening for cardiovascular condition       Relevant Orders   Comprehensive metabolic panel with GFR (Completed)   Lipid panel (Completed)     Abnormal glucose       Relevant Orders   Hemoglobin A1c (Completed)        Orders & Medications Medications:  Meds ordered this encounter  Medications   buPROPion (WELLBUTRIN XL) 150 MG 24 hr tablet    Sig: Take 1 tablet (150 mg total) by mouth daily.    Dispense:  90 tablet    Refill:  1   Semaglutide-Weight Management 0.25 MG/0.5ML SOAJ    Sig: Inject 0.25  mg into the skin once a week. Increase to next tolerable dose after at least 4 weeks.    Dispense:  2 mL    Refill:  2   Orders Placed This Encounter  Procedures   CBC   Comprehensive metabolic panel with GFR   Hemoglobin A1c   Lipid panel   Ambulatory referral to Pulmonology     Return in about 3 months (around 01/02/2024) for Weight MGMT f/u.    Jerrol Banana, MD, [facility]   Primary Care Sports Medicine [facility] and [facility] at [facility]

## 2023-10-03 ENCOUNTER — Other Ambulatory Visit: Payer: Self-pay | Admitting: Family Medicine

## 2023-10-03 ENCOUNTER — Encounter: Payer: Self-pay | Admitting: Family Medicine

## 2023-10-03 DIAGNOSIS — F330 Major depressive disorder, recurrent, mild: Secondary | ICD-10-CM | POA: Insufficient documentation

## 2023-10-03 DIAGNOSIS — Z0000 Encounter for general adult medical examination without abnormal findings: Secondary | ICD-10-CM | POA: Insufficient documentation

## 2023-10-03 DIAGNOSIS — E875 Hyperkalemia: Secondary | ICD-10-CM

## 2023-10-03 LAB — COMPREHENSIVE METABOLIC PANEL WITH GFR
ALT: 68 IU/L — ABNORMAL HIGH (ref 0–32)
AST: 55 IU/L — ABNORMAL HIGH (ref 0–40)
Albumin: 4 g/dL (ref 3.9–4.9)
Alkaline Phosphatase: 127 IU/L — ABNORMAL HIGH (ref 44–121)
BUN/Creatinine Ratio: 17 (ref 9–23)
BUN: 11 mg/dL (ref 6–24)
Bilirubin Total: 0.3 mg/dL (ref 0.0–1.2)
CO2: 25 mmol/L (ref 20–29)
Calcium: 9.1 mg/dL (ref 8.7–10.2)
Chloride: 104 mmol/L (ref 96–106)
Creatinine, Ser: 0.66 mg/dL (ref 0.57–1.00)
Globulin, Total: 2.5 g/dL (ref 1.5–4.5)
Glucose: 104 mg/dL — ABNORMAL HIGH (ref 70–99)
Potassium: 5.7 mmol/L — ABNORMAL HIGH (ref 3.5–5.2)
Sodium: 141 mmol/L (ref 134–144)
Total Protein: 6.5 g/dL (ref 6.0–8.5)
eGFR: 108 mL/min/{1.73_m2} (ref >59)

## 2023-10-03 LAB — CBC
Hematocrit: 41.7 % (ref 34.0–46.6)
Hemoglobin: 14.1 g/dL (ref 11.1–15.9)
MCH: 32.6 pg (ref 26.6–33.0)
MCHC: 33.8 g/dL (ref 31.5–35.7)
MCV: 96 fL (ref 79–97)
Platelets: 254 10*3/uL (ref 150–450)
RBC: 4.33 x10E6/uL (ref 3.77–5.28)
RDW: 12.3 % (ref 11.7–15.4)
WBC: 5.4 10*3/uL (ref 3.4–10.8)

## 2023-10-03 LAB — LIPID PANEL
Chol/HDL Ratio: 2.4 ratio (ref 0.0–4.4)
Cholesterol, Total: 167 mg/dL (ref 100–199)
HDL: 71 mg/dL (ref >39)
LDL Chol Calc (NIH): 81 mg/dL (ref 0–99)
Triglycerides: 78 mg/dL (ref 0–149)
VLDL Cholesterol Cal: 15 mg/dL (ref 5–40)

## 2023-10-03 LAB — HEMOGLOBIN A1C
Est. average glucose Bld gHb Est-mCnc: 114 mg/dL
Hgb A1c MFr Bld: 5.6 % (ref 4.8–5.6)

## 2023-10-03 NOTE — Assessment & Plan Note (Addendum)
 She is experiencing challenges with weight loss, particularly due to insurance issues affecting medication access. She is interested in exploring new medications and compounding options for weight management. She has previously used medications such as phentermine and topiramate, and has experience with Rybelsus, which she found effective in controlling appetite. She is considering compounding options for medications like Wegovy.  Weight management Obesity management discussed with focus on weight loss challenges and set point. Compounded semaglutide from [employer] considered for cost. Emphasized managing GI side effects and regular follow-up for dosage adjustment. - Send prescription for compounded semaglutide to [employer]. - Educate on managing side effects, particularly constipation and nausea. - Schedule follow-up every three months for weight management and medication adjustment. - Encourage lifestyle modifications including diet and exercise.  Sleep Apnea Suspected sleep apnea may affect mood, weight, and energy. Importance of diagnosis and treatment discussed. - Refer to sleep medicine for evaluation and possible home sleep study.

## 2023-10-03 NOTE — Assessment & Plan Note (Signed)
 Annual examination completed, risk stratification labs ordered, anticipatory guidance provided.  We will follow labs once resulted.

## 2023-10-03 NOTE — Assessment & Plan Note (Signed)
 Mood and stress levels have been variable, not great but not awful. She recalls a previous positive experience with Wellbutrin for smoking cessation and is open to using it again for mood and weight management.  Mood / Stress Mood may improve with weight loss medications. Wellbutrin discussed for mood enhancement and synergy with weight loss.  - Prescribe Wellbutrin 150 mg once daily. - Adjust dosage based on response and insurance coverage. - Monitor mood and adjust treatment as needed.

## 2023-10-03 NOTE — Assessment & Plan Note (Signed)
 Primary osteoarthritis of knees. Knee pain additionally affecting her foot due to compensatory mechanisms from insufficient knee strengthening. Celebrex advised given relative GI safety with newly prescribed Wegovy. - Prescribe Celebrex as needed, up to twice daily with food. - Encourage resumption of knee strengthening exercises. - Continue duloxetine for joint pain management. - Consider knee injections if pain persists.

## 2023-10-03 NOTE — Patient Instructions (Addendum)
-   Obtain fasting labs with orders provided (can have water or black coffee but otherwise no food or drink x 8 hours before labs) - Review information provided - Attend eye doctor annually, dentist every 6 months, work towards or maintain 30 minutes of moderate intensity physical activity at least 5 days per week, and consume a balanced diet - Return in 1 year for physical - Contact us for any questions between now and then   Patient Care Plan  1. Weight Management:    - Prescription for compounded semaglutide will be sent to [facility].    - Learn how to manage side effects like constipation and nausea.    - Schedule follow-up every three months for weight management and medication adjustments.    - Focus on lifestyle changes, including diet and exercise.  2. Possible Sleep Apnea:    - You will be referred to sleep medicine for an evaluation and possible home sleep study.  3. Mood and Stress:    - Start taking Wellbutrin 150 mg once daily.    - Monitor your mood and let us know if adjustments are needed based on response and insurance coverage.  4. Joint and Knee Pain:    - Take Celebrex as needed, up to twice daily with food, for knee pain.    - Resume knee strengthening exercises.    - Continue taking duloxetine for joint pain management.    - Consider knee injections if pain continues.  Red Flags  - Contact us if you experience severe side effects from medications, significant mood changes, or persistent pain.  Key Ways to Help Your Body Reach and Maintain a Healthier "Set Point"  Gradual Calorie Reduction: Eating slightly fewer calories over time can help your body adjust to a lower weight without triggering hunger or metabolic slow-downs. Focus on balanced meals with more protein and fewer refined carbs.  Reference: Nancy Fetter, Norva Riffle [facility], Wilding JP. Management of obesity. The Lancet. 2016.  Regular Exercise: A mix of aerobic and strength training helps  maintain weight loss by boosting metabolism and burning fat. High-intensity interval training (HIIT) may be especially effective.  Reference: Weston Brass, Montani JP. Body composition and thermogenesis in pathways to obesity. Obesity Reviews. 2012.  Mindful Eating: Paying attention to your hunger and fullness cues can help prevent overeating and lead to more sustainable weight loss.  Reference: Carey Bullocks TA. Behavioral treatment of obesity. [facility]. 2011.  Sleep and Stress Management: Improving sleep and managing stress can reduce cravings and overeating by balancing hormones that control hunger and fullness.  Reference: Carma Lair, Hu FB. Short sleep duration and weight gain. Obesity ([place]). 2008.  Medications: Drugs like GLP-1 receptor agonists can help reduce hunger and aid in long-term weight loss when combined with lifestyle changes.  Reference: Pi-Sunyer X. The medical management of obesity. NEJM. 2019.  Bariatric Surgery: For some, weight-loss surgery can effectively reset the body's weight set point by altering how the body processes food, leading to lasting weight loss.  Reference: Stefater MA, Wilson-Prez HE, et al. Insights from mechanistic comparisons of bariatric surgeries. Endocrine Reviews. 2012

## 2023-10-05 ENCOUNTER — Other Ambulatory Visit: Payer: Self-pay

## 2023-10-05 DIAGNOSIS — E66813 Obesity, class 3: Secondary | ICD-10-CM

## 2023-10-05 DIAGNOSIS — M1712 Unilateral primary osteoarthritis, left knee: Secondary | ICD-10-CM

## 2023-10-05 MED ORDER — SEMAGLUTIDE-WEIGHT MANAGEMENT 0.25 MG/0.5ML ~~LOC~~ SOAJ: 0.2500 mg | SUBCUTANEOUS | 2 refills | Status: AC

## 2023-10-05 MED ORDER — CELECOXIB 100 MG PO CAPS
100.0000 mg | ORAL_CAPSULE | Freq: Two times a day (BID) | ORAL | 0 refills | Status: DC | PRN
Start: 2023-10-05 — End: 2024-01-05

## 2023-10-06 NOTE — Telephone Encounter (Signed)
 Please review and advise.    Thank you.

## 2023-10-07 ENCOUNTER — Ambulatory Visit
Admission: RE | Admit: 2023-10-07 | Discharge: 2023-10-07 | Disposition: A | Discharge status: Home / Self Care | Source: Ambulatory Visit | Attending: Family Medicine | Admitting: Family Medicine

## 2023-10-07 DIAGNOSIS — Z1231 Encounter for screening mammogram for malignant neoplasm of breast: Secondary | ICD-10-CM | POA: Diagnosis present

## 2023-10-13 ENCOUNTER — Encounter: Payer: Self-pay | Admitting: Sleep Medicine

## 2023-10-13 ENCOUNTER — Ambulatory Visit: Admitting: Sleep Medicine

## 2023-10-13 VITALS — BP 128/90 | HR 84 | Temp 97.8°F | Ht 65.0 in | Wt 328.8 lb

## 2023-10-13 DIAGNOSIS — F411 Generalized anxiety disorder: Secondary | ICD-10-CM

## 2023-10-13 DIAGNOSIS — G4733 Obstructive sleep apnea (adult) (pediatric): Secondary | ICD-10-CM | POA: Diagnosis not present

## 2023-10-13 NOTE — Progress Notes (Signed)
 Name:Tammie Grimes MRN: [id number] DOB: January 30, 1976   CHIEF COMPLAINT:  SNORING   HISTORY OF PRESENT ILLNESS:  Mr. Tammie Grimes is a 48 y.o. w/ a h/o morbid obesity, anxiety and depression who presents for c/o loud snoring and fatigue which has been present for several years. Reports nocturnal awakenings due to her bed partner snoring and due to nocturia, however does not have difficulty falling back to sleep. Denies any significant weight changes. Admits night sweats. Denies morning headaches, RLS symptoms, dream enactment, cataplexy, hypnagogic or hypnapompic hallucinations. Reports a family history of sleep apnea. Denies drowsy driving. Drinks a few cups of coffee daily, occasional alcohol use, denies tobacco or illicit drug use.   Bedtime 9 pm-12 am Sleep onset 10-30 mins Rise time 7-10 am   EPWORTH SLEEP SCORE 6     No data to display          PAST MEDICAL HISTORY :   has a past medical history of Allergy.  has a past surgical history that includes Colonoscopy with propofol (N/A, 09/24/2022). Prior to Admission medications   Medication Sig Start Date End Date Taking? Authorizing Provider  ACETAMINOPHEN PO Take 1,000 mg by mouth every 6 (six) hours as needed.    [provider]  buPROPion (WELLBUTRIN XL) 150 MG 24 hr tablet Take 1 tablet (150 mg total) by mouth daily. 10/02/23   Jerrol Banana, MD  celecoxib (CELEBREX) 100 MG capsule Take 1 capsule (100 mg total) by mouth 2 (two) times daily as needed. 10/05/23   Jerrol Banana, MD  DULoxetine (CYMBALTA) 60 MG capsule Take 1 capsule by mouth daily. 08/24/23   Jerrol Banana, MD  fluticasone (FLONASE) 50 MCG/ACT nasal spray Place 1 spray into both nostrils daily.    [provider]  loratadine (CLARITIN) 10 MG tablet Take 10 mg by mouth daily.    [provider]  montelukast (SINGULAIR) 10 MG tablet Take 1 tablet by mouth at bedtime. 06/09/23   Jerrol Banana, MD  phentermine 30 MG  capsule Take 1 capsule by mouth every morning. Patient not taking: Reported on 10/02/2023 12/12/22   Jerrol Banana, MD  Semaglutide-Weight Management 0.25 MG/0.5ML SOAJ Inject 0.25 mg into the skin once a week. Increase to next tolerable dose after at least 4 weeks. 10/05/23   Jerrol Banana, MD  topiramate (TOPAMAX) 25 MG capsule Take 1 capsule by mouth daily. Patient not taking: Reported on 10/02/2023 12/18/22   Jerrol Banana, MD   Allergies  Allergen Reactions   Other Other (See Comments) and Itching    Dust mites     FAMILY HISTORY:  family history is not on file. SOCIAL HISTORY:  reports that she quit smoking about 18 years ago. Her smoking use included cigarettes. She has never used smokeless tobacco. She reports current alcohol use of about 14.0 standard drinks of alcohol per week. She reports that she does not use drugs.   Review of Systems:  Gen:  Denies  fever, sweats, chills weight loss  HEENT: Denies blurred vision, double vision, ear pain, eye pain, hearing loss, nose bleeds, sore throat Cardiac:  No dizziness, chest pain or heaviness, chest tightness,edema, No JVD Resp:   No cough, -sputum production, -shortness of breath,-wheezing, -hemoptysis,  Gi: Denies swallowing difficulty, stomach pain, nausea or vomiting, diarrhea, constipation, bowel incontinence Gu:  Denies bladder incontinence, burning urine Ext:   Denies Joint pain, stiffness or swelling Skin: Denies  skin rash, easy  bruising or bleeding or hives Endoc:  Denies polyuria, polydipsia , polyphagia or weight change Psych:   Denies depression, insomnia or hallucinations  Other:  All other systems negative  VITAL SIGNS: BP (!) 128/90 (BP Location: Left Arm, Patient Position: Sitting, Cuff Size: Normal)   Pulse 84   Temp 97.8 F (36.6 C) (Temporal)   Ht 5\' 5"  (1.651 m)   Wt (!) 328 lb 12.8 oz (149.1 kg)   SpO2 96%   BMI 54.72 kg/m     Physical Examination:   General Appearance: No distress  EYES  PERRLA, EOM intact.   NECK Supple, No JVD Pulmonary: normal breath sounds, No wheezing.  CardiovascularNormal S1,S2.  No m/r/g.   Abdomen: Benign, Soft, non-tender. Skin:   warm, no rashes, no ecchymosis  Extremities: normal, no cyanosis, clubbing. Neuro:without focal findings,  speech normal  PSYCHIATRIC: Mood, affect within normal limits.   ASSESSMENT AND PLAN  OSA I suspect that OSA is likely present due to clinical presentation. Discussed the consequences of untreated sleep apnea. Advised not to drive drowsy for safety of patient and others. Will complete further evaluation with a home sleep study and follow up to review results.    Anxiety Stable, on current management. Following with PCP.   Morbid obesity Counseled patient on diet and lifestyle modification.    MEDICATION ADJUSTMENTS/LABS AND TESTS ORDERED: Recommend Sleep Study   Patient  satisfied with Plan of action and management. All questions answered  Follow up to review HST results and treatment plan.   I spent a total of 51 minutes reviewing chart data, face-to-face evaluation with the patient, counseling and coordination of care as detailed above.    Tammie Grimes, M.D.  Sleep Medicine [place] [facility]

## 2023-10-13 NOTE — Patient Instructions (Signed)
 Marland Kitchen

## 2023-10-20 ENCOUNTER — Encounter

## 2023-10-20 DIAGNOSIS — G4733 Obstructive sleep apnea (adult) (pediatric): Secondary | ICD-10-CM

## 2023-10-29 DIAGNOSIS — R069 Unspecified abnormalities of breathing: Secondary | ICD-10-CM | POA: Diagnosis not present

## 2023-11-09 ENCOUNTER — Ambulatory Visit: Admitting: Internal Medicine

## 2023-11-12 ENCOUNTER — Encounter: Payer: Self-pay | Admitting: Family Medicine

## 2023-11-25 ENCOUNTER — Encounter: Payer: Self-pay | Admitting: Sleep Medicine

## 2024-01-01 ENCOUNTER — Ambulatory Visit: Admitting: Family Medicine

## 2024-01-05 ENCOUNTER — Ambulatory Visit: Admitting: Family Medicine

## 2024-01-05 VITALS — BP 120/86 | HR 90 | Ht 65.0 in | Wt 310.2 lb

## 2024-01-05 DIAGNOSIS — M1711 Unilateral primary osteoarthritis, right knee: Secondary | ICD-10-CM | POA: Diagnosis not present

## 2024-01-05 DIAGNOSIS — F418 Other specified anxiety disorders: Secondary | ICD-10-CM

## 2024-01-05 DIAGNOSIS — Z6841 Body Mass Index (BMI) 40.0 and over, adult: Secondary | ICD-10-CM

## 2024-01-05 DIAGNOSIS — E66813 Obesity, class 3: Secondary | ICD-10-CM | POA: Diagnosis not present

## 2024-01-05 DIAGNOSIS — G4733 Obstructive sleep apnea (adult) (pediatric): Secondary | ICD-10-CM | POA: Insufficient documentation

## 2024-01-05 DIAGNOSIS — M1712 Unilateral primary osteoarthritis, left knee: Secondary | ICD-10-CM

## 2024-01-05 DIAGNOSIS — J3089 Other allergic rhinitis: Secondary | ICD-10-CM

## 2024-01-05 MED ORDER — MONTELUKAST SODIUM 10 MG PO TABS: 10.0000 mg | ORAL_TABLET | Freq: Every day | ORAL | 3 refills | Status: AC

## 2024-01-05 MED ORDER — DULOXETINE HCL 60 MG PO CPEP: 60.0000 mg | ORAL_CAPSULE | Freq: Every day | ORAL | 3 refills | Status: AC

## 2024-01-05 MED ORDER — CELECOXIB 100 MG PO CAPS: 100.0000 mg | ORAL_CAPSULE | Freq: Two times a day (BID) | ORAL | 2 refills | Status: AC | PRN

## 2024-01-05 NOTE — Assessment & Plan Note (Signed)
 Mood and stress management - Takes Wellbutrin  150 mg daily for mood stabilization - Mood remains stable despite increased life stressors, including bereavement and caregiving responsibilities - Recently refilled Wellbutrin  prescription with a three-month supply available   Depression Well-managed on Wellbutrin  150 mg daily. Mood stable despite stressors. - Continue Wellbutrin  150 mg daily. - Monitor mood and adjust medication as needed.

## 2024-01-05 NOTE — Progress Notes (Signed)
 Primary Care / Sports Medicine Office Visit  Patient Information:  Patient ID: Tammie Grimes, female DOB: January 23, 1976 Age: 48 y.o. MRN: [id number]   Tammie Grimes is a pleasant 48 y.o. female presenting with the following:  Chief Complaint  Patient presents with   Weight Check    Patient has been taking Semiglutide 0.25mg  and doing well on it. No side effects and looking to go up in dosage.    Vitals:   01/05/24 1321  BP: 120/86  Pulse: 90  SpO2: 98%   Vitals:   01/05/24 1321  Weight: (!) 310 lb 3.2 oz (140.7 kg)  Height: 5' 5 (1.651 m)   Body mass index is 51.62 kg/m.  No results found.   Independent interpretation of notes and tests performed by another provider:   None  Procedures performed:   None  Pertinent History, Exam, Impression, and Recommendations:   Problem List Items Addressed This Visit     Allergic rhinitis   Allergic rhinitis - Takes Singulair  daily for dust mite allergy - Uses over-the-counter Flonase and Claritin as needed for allergy symptoms  Allergic rhinitis Managed with Singulair , Flonase, and Claritin. - Continue Singulair  daily. - Continue over-the-counter Flonase and Claritin as needed.      Class 3 severe obesity due to excess calories with serious comorbidity and body mass index (BMI) of 50.0 to 59.9 in adult - Primary   Weight management and appetite control - Weight loss of 18 pounds since April 8th - Currently using semaglutide  0.25 mg weekly for weight management, with a rarely missed doses - Follows a Mediterranean diet and practices intermittent fasting from 12 PM to 7 PM - Semaglutide  provides appetite reduction and early satiety, allowing for smaller portion sizes - Continues weight management efforts despite increased stress from personal events  Obesity Lost 18 pounds since April 8th. On semaglutide  0.25 mg weekly, considering increase. Following Mediterranean diet and intermittent fasting. - Continue  semaglutide  0.25 mg weekly or consider increasing to 0.5 mg weekly based on preference and tolerance. - Continue Mediterranean diet and intermittent fasting. - Monitor weight and adjust semaglutide  dose as needed. - Contact sleep medicine to discuss potential coverage for Zepbound due to mild sleep apnea diagnosis.      Depression with anxiety   Mood and stress management - Takes Wellbutrin  150 mg daily for mood stabilization - Mood remains stable despite increased life stressors, including bereavement and caregiving responsibilities - Recently refilled Wellbutrin  prescription with a three-month supply available  Depression Well-managed on Wellbutrin  150 mg daily. Mood stable despite stressors. - Continue Wellbutrin  150 mg daily. - Monitor mood and adjust medication as needed.      Relevant Medications   DULoxetine  (CYMBALTA ) 60 MG capsule   OSA (obstructive sleep apnea)   Sleep-disordered breathing - History of mild sleep apnea diagnosed prior to recent weight loss - Has not pursued treatment for sleep apnea - Reports improved energy levels since weight loss  Mild sleep apnea Weight loss may improve symptoms. Discussed treatment options and insurance coverage for Zepbound. - Contact sleep medicine to discuss potential coverage for Zepbound due to mild sleep apnea diagnosis. - Monitor energy levels and sleep quality.      Primary osteoarthritis of left knee   Chronic pain and musculoskeletal symptoms - Experiences occasional knee pain - Inconsistent with physical therapy exercises for knee pain - Not currently taking Celebrex  - Uses duloxetine  60 mg daily for chronic pain management, but has been off duloxetine   for approximately one month  Knee pain Improved with weight loss, occasional foot strain, inconsistent physical therapy. - Encouraged resumption of home-based physical therapy exercises as tolerated. - Prescribed Celebrex  as needed for knee pain, up to twice daily.  Take with food. - Continue duloxetine  for chronic pain management.      Relevant Medications   celecoxib  (CELEBREX ) 100 MG capsule   DULoxetine  (CYMBALTA ) 60 MG capsule   Primary osteoarthritis of right knee   Relevant Medications   celecoxib  (CELEBREX ) 100 MG capsule   DULoxetine  (CYMBALTA ) 60 MG capsule     Orders & Medications Medications:  Meds ordered this encounter  Medications   celecoxib  (CELEBREX ) 100 MG capsule    Sig: Take 1 capsule (100 mg total) by mouth 2 (two) times daily as needed.    Dispense:  60 capsule    Refill:  2   DULoxetine  (CYMBALTA ) 60 MG capsule    Sig: Take 1 capsule (60 mg total) by mouth daily.    Dispense:  90 capsule    Refill:  3   montelukast  (SINGULAIR ) 10 MG tablet    Sig: Take 1 tablet (10 mg total) by mouth at bedtime.    Dispense:  90 tablet    Refill:  3    Patient will need an office visit for further refills.   No orders of the defined types were placed in this encounter.    No follow-ups on file.     Selinda JINNY Ku, MD, [facility]   Primary Care Sports Medicine Primary Care and Sports Medicine at [facility]

## 2024-01-05 NOTE — Assessment & Plan Note (Signed)
 Sleep-disordered breathing - History of mild sleep apnea diagnosed prior to recent weight loss - Has not pursued treatment for sleep apnea - Reports improved energy levels since weight loss  Mild sleep apnea Weight loss may improve symptoms. Discussed treatment options and insurance coverage for Zepbound. - Contact sleep medicine to discuss potential coverage for Zepbound due to mild sleep apnea diagnosis. - Monitor energy levels and sleep quality.

## 2024-01-05 NOTE — Assessment & Plan Note (Signed)
 Chronic pain and musculoskeletal symptoms - Experiences occasional knee pain - Inconsistent with physical therapy exercises for knee pain - Not currently taking Celebrex  - Uses duloxetine  60 mg daily for chronic pain management, but has been off duloxetine  for approximately one month  Knee pain Improved with weight loss, occasional foot strain, inconsistent physical therapy. - Encouraged resumption of home-based physical therapy exercises as tolerated. - Prescribed Celebrex  as needed for knee pain, up to twice daily. Take with food. - Continue duloxetine  for chronic pain management.

## 2024-01-05 NOTE — Assessment & Plan Note (Signed)
 Weight management and appetite control - Weight loss of 18 pounds since April 8th - Currently using semaglutide  0.25 mg weekly for weight management, with a rarely missed doses - Follows a Mediterranean diet and practices intermittent fasting from 12 PM to 7 PM - Semaglutide  provides appetite reduction and early satiety, allowing for smaller portion sizes - Continues weight management efforts despite increased stress from personal events  Obesity Lost 18 pounds since April 8th. On semaglutide  0.25 mg weekly, considering increase. Following Mediterranean diet and intermittent fasting. - Continue semaglutide  0.25 mg weekly or consider increasing to 0.5 mg weekly based on preference and tolerance. - Continue Mediterranean diet and intermittent fasting. - Monitor weight and adjust semaglutide  dose as needed. - Contact sleep medicine to discuss potential coverage for Zepbound due to mild sleep apnea diagnosis.

## 2024-01-05 NOTE — Assessment & Plan Note (Signed)
 Allergic rhinitis - Takes Singulair  daily for dust mite allergy - Uses over-the-counter Flonase and Claritin as needed for allergy symptoms  Allergic rhinitis Managed with Singulair , Flonase, and Claritin. - Continue Singulair  daily. - Continue over-the-counter Flonase and Claritin as needed.

## 2024-01-05 NOTE — Patient Instructions (Addendum)
 Allergic Rhinitis - Continue taking Singulair  daily for dust mite allergy. - Use over-the-counter Flonase and Claritin as needed for allergy symptoms.  Weight Management - Continue semaglutide  0.25 mg weekly or consider increasing to 0.5 mg weekly based on preference and tolerance. - Follow a Mediterranean diet and practice intermittent fasting from 12 PM to 7 PM. - Monitor weight and adjust semaglutide  dose as needed. - Contact sleep medicine to discuss potential coverage for Zepbound due to mild sleep apnea diagnosis.  Mood and Emotional Health - Continue taking Wellbutrin  150 mg daily for mood stabilization. - Monitor mood and adjust medication as needed.  Sleep-Disordered Breathing - Contact sleep medicine to discuss potential coverage for Zepbound due to mild sleep apnea diagnosis. - Monitor energy levels and sleep quality.  Knee Pain - Resume home-based physical therapy exercises as tolerated. - Take Celebrex  as needed for knee pain, up to twice daily, with food. - Continue duloxetine  for chronic pain management.  When to Seek Help - Seek medical attention if experiencing severe allergic reactions, significant mood changes, or worsening knee pain.

## 2024-03-07 ENCOUNTER — Other Ambulatory Visit: Payer: Self-pay | Admitting: Family Medicine

## 2024-03-07 DIAGNOSIS — F330 Major depressive disorder, recurrent, mild: Secondary | ICD-10-CM

## 2024-03-08 NOTE — Telephone Encounter (Signed)
 Provider aware of abnormal lab results Requested Prescriptions  Pending Prescriptions Disp Refills   buPROPion  (WELLBUTRIN  XL) 150 MG 24 hr tablet [Pharmacy Med Name: Bupropion  Hydrochloride 150mg  Extended-Release (XL) Tablet] 90 tablet 1    Sig: Take 1 tablet by mouth daily.     Psychiatry: Antidepressants - bupropion  Failed - 03/08/2024  2:55 PM      Failed - AST in normal range and within 360 days    AST  Date Value Ref Range Status  10/02/2023 55 (H) 0 - 40 IU/L Final         Failed - ALT in normal range and within 360 days    ALT  Date Value Ref Range Status  10/02/2023 68 (H) 0 - 32 IU/L Final         Passed - Cr in normal range and within 360 days    Creatinine, Ser  Date Value Ref Range Status  10/02/2023 0.66 0.57 - 1.00 mg/dL Final         Passed - Completed PHQ-2 or PHQ-9 in the last 360 days      Passed - Last BP in normal range    BP Readings from Last 1 Encounters:  01/05/24 120/86         Passed - Valid encounter within last 6 months    Recent Outpatient Visits           2 months ago Class 3 severe obesity due to excess calories with serious comorbidity and body mass index (BMI) of 50.0 to 59.9 in adult   [facility] Primary Care & Sports Medicine at [facility], Selinda PARAS, MD   5 months ago Healthcare maintenance   [facility] Primary Care & Sports Medicine at [facility], Selinda PARAS, MD       Future Appointments             In 6 months Ku, Selinda PARAS, MD [facility] Primary Care & Sports Medicine at [facility], [phone]

## 2024-03-18 ENCOUNTER — Other Ambulatory Visit: Payer: Self-pay | Admitting: Medical Genetics

## 2024-08-03 ENCOUNTER — Other Ambulatory Visit: Payer: Self-pay | Admitting: Family Medicine

## 2024-08-03 DIAGNOSIS — Z6841 Body Mass Index (BMI) 40.0 and over, adult: Secondary | ICD-10-CM

## 2024-08-04 NOTE — Telephone Encounter (Signed)
 Requested medications are due for refill today.  unsure  Requested medications are on the active medications list.  yes  Last refill. 10/05/2023 2mL 2 rf  Future visit scheduled.   yes  Notes to clinic.  Please review for refill.     Requested Prescriptions  Pending Prescriptions Disp Refills   semaglutide -weight management (WEGOVY ) 0.25 MG/0.5ML SOAJ SQ injection [Pharmacy Med Name: SEMAGLUTIDE  2.5MG /MLINJ(5) OLYMP] 5 mL 2    Sig: INJECT 10 UNITS (0.25MG ) SUBCUTANEOUSLY ONCE A WEEK FOR 4 WEEKS, THEN INCREASE TO NEXT TOLERABLE DOSE AFTER FOUR (4) WEEKS     Endocrinology:  Diabetes - GLP-1 Receptor Agonists - semaglutide  Failed - 08/04/2024  2:14 PM      Failed - HBA1C in normal range and within 180 days    Hgb A1c MFr Bld  Date Value Ref Range Status  10/02/2023 5.6 4.8 - 5.6 % Final    Comment:             Prediabetes: 5.7 - 6.4          Diabetes: >6.4          Glycemic control for adults with diabetes: <7.0          Failed - Valid encounter within last 6 months    Recent Outpatient Visits           7 months ago Class 3 severe obesity due to excess calories with serious comorbidity and body mass index (BMI) of 50.0 to 59.9 in adult   [facility] Primary Care & Sports Medicine at [facility], Selinda PARAS, MD   10 months ago Healthcare maintenance   [facility] Primary Care & Sports Medicine at [facility], Selinda PARAS, MD              Passed - Cr in normal range and within 360 days    Creatinine, Ser  Date Value Ref Range Status  10/02/2023 0.66 0.57 - 1.00 mg/dL Final

## 2024-08-11 ENCOUNTER — Encounter: Payer: Self-pay | Admitting: Family Medicine

## 2024-08-16 ENCOUNTER — Ambulatory Visit: Admitting: Family Medicine

## 2024-10-03 ENCOUNTER — Encounter: Admitting: Family Medicine

## 2024-10-11 ENCOUNTER — Encounter: Admitting: Family Medicine
